# Patient Record
Sex: Male | Born: 1965 | Race: White | Hispanic: No | Smoking: Former smoker
Health system: Southern US, Community
[De-identification: ages and names within clinical notes are randomized; demographics above are authoritative.]

## PROBLEM LIST (undated history)

## (undated) DIAGNOSIS — J449 Chronic obstructive pulmonary disease, unspecified: Secondary | ICD-10-CM

## (undated) DIAGNOSIS — G20A1 Parkinson's disease without dyskinesia, without mention of fluctuations: Secondary | ICD-10-CM

## (undated) DIAGNOSIS — F419 Anxiety disorder, unspecified: Secondary | ICD-10-CM

## (undated) DIAGNOSIS — E7800 Pure hypercholesterolemia, unspecified: Secondary | ICD-10-CM

## (undated) DIAGNOSIS — K259 Gastric ulcer, unspecified as acute or chronic, without hemorrhage or perforation: Secondary | ICD-10-CM

## (undated) DIAGNOSIS — F32A Depression, unspecified: Secondary | ICD-10-CM

## (undated) DIAGNOSIS — F329 Major depressive disorder, single episode, unspecified: Secondary | ICD-10-CM

## (undated) HISTORY — PX: NO PAST SURGERIES: SHX2092

---
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
## 2005-08-20 ENCOUNTER — Ambulatory Visit: Payer: Self-pay | Admitting: Orthopaedic Surgery

## 2006-03-31 ENCOUNTER — Emergency Department: Payer: Self-pay | Admitting: Internal Medicine

## 2007-09-24 ENCOUNTER — Emergency Department: Payer: Self-pay | Admitting: Emergency Medicine

## 2010-03-28 ENCOUNTER — Ambulatory Visit
Admission: RE | Admit: 2010-03-28 | Discharge: 2010-03-28 | Payer: Self-pay | Source: Home / Self Care | Attending: Internal Medicine | Admitting: Internal Medicine

## 2010-03-29 DIAGNOSIS — J309 Allergic rhinitis, unspecified: Secondary | ICD-10-CM | POA: Insufficient documentation
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
## 2011-11-02 ENCOUNTER — Emergency Department (HOSPITAL_COMMUNITY)
Admission: EM | Admit: 2011-11-02 | Discharge: 2011-11-02 | Disposition: A | Discharge status: Home / Self Care | Payer: BC Managed Care – PPO | Attending: Emergency Medicine | Admitting: Emergency Medicine

## 2011-11-02 ENCOUNTER — Encounter (HOSPITAL_COMMUNITY): Payer: Self-pay | Admitting: Emergency Medicine

## 2011-11-02 DIAGNOSIS — K089 Disorder of teeth and supporting structures, unspecified: Secondary | ICD-10-CM | POA: Insufficient documentation

## 2011-11-02 DIAGNOSIS — K0889 Other specified disorders of teeth and supporting structures: Secondary | ICD-10-CM

## 2011-11-02 DIAGNOSIS — F17200 Nicotine dependence, unspecified, uncomplicated: Secondary | ICD-10-CM | POA: Insufficient documentation

## 2011-11-02 MED ORDER — HYDROCODONE-ACETAMINOPHEN 5-325 MG PO TABS: ORAL_TABLET | ORAL | Status: AC

## 2011-11-02 MED ORDER — AMOXICILLIN 250 MG PO CAPS
500.0000 mg | ORAL_CAPSULE | Freq: Once | ORAL | Status: AC
  Administered 2011-11-02: 500 mg via ORAL
  Filled 2011-11-02: qty 2

## 2011-11-02 MED ORDER — HYDROCODONE-ACETAMINOPHEN 5-325 MG PO TABS
1.0000 | ORAL_TABLET | Freq: Once | ORAL | Status: AC
  Administered 2011-11-02: 1 via ORAL
  Filled 2011-11-02: qty 1

## 2011-11-02 MED ORDER — PENICILLIN V POTASSIUM 500 MG PO TABS: 500.0000 mg | ORAL_TABLET | Freq: Four times a day (QID) | ORAL | Status: AC

## 2011-11-02 NOTE — ED Notes (Signed)
Patient c/o lower right tooth pain since yesterday.

## 2011-11-02 NOTE — ED Provider Notes (Signed)
History     CSN: [id number]  Arrival date & time 11/02/11  0018   First MD Initiated Contact with Patient 11/02/11 [phone]      Chief Complaint  Patient presents with  . Dental Pain    (Consider location/radiation/quality/duration/timing/severity/associated sxs/prior treatment) Patient is a 46 y.o. male presenting with tooth pain. The history is provided by the patient.  Dental PainThe primary symptoms include mouth pain. Primary symptoms do not include oral bleeding, oral lesions, headaches, fever, shortness of breath, sore throat, angioedema or cough. The symptoms began yesterday. The symptoms are worsening. The symptoms are new. The symptoms occur constantly.  Mouth pain occurs constantly. Affected locations include: teeth and gum(s). The mouth pain is currently at 10/10.  Additional symptoms include: dental sensitivity to temperature, gum swelling and gum tenderness. Additional symptoms do not include: purulent gums, trismus, jaw pain, facial swelling, trouble swallowing, drooling, ear pain, nosebleeds and swollen glands. Medical issues include: smoking and periodontal disease.    History reviewed. No pertinent past medical history.  History reviewed. No pertinent past surgical history.  No family history on file.  History  Substance Use Topics  . Smoking status: Current Everyday Smoker  . Smokeless tobacco: Not on file  . Alcohol Use: No      Review of Systems  Constitutional: Negative for fever and appetite change.  HENT: Positive for dental problem. Negative for ear pain, nosebleeds, congestion, sore throat, facial swelling, drooling, trouble swallowing, neck pain and neck stiffness.   Eyes: Negative for pain and visual disturbance.  Respiratory: Negative for cough and shortness of breath.   Neurological: Negative for dizziness, facial asymmetry and headaches.  Hematological: Negative for adenopathy.  All other systems reviewed and are negative.    Allergies  Review  of patient's allergies indicates no known allergies.  Home Medications   Current Outpatient Rx  Name Route Sig Dispense Refill  . HYDROCODONE-ACETAMINOPHEN 5-325 MG PO TABS  Take one-two tabs po q 4-6 hrs prn pain 24 tablet 0  . PENICILLIN V POTASSIUM 500 MG PO TABS Oral Take 1 tablet (500 mg total) by mouth 4 (four) times daily. 40 tablet 0    BP 184/99  Pulse 57  Temp 97.6 F (36.4 C) (Oral)  Resp 20  SpO2 97%  Physical Exam  Nursing note and vitals reviewed. Constitutional: He is oriented to person, place, and time. He appears well-developed and well-nourished. No distress.  HENT:  Head: Normocephalic and atraumatic. No trismus in the jaw.  Right Ear: Tympanic membrane and ear canal normal.  Left Ear: Tympanic membrane and ear canal normal.  Mouth/Throat: Uvula is midline, oropharynx is clear and moist and mucous membranes are normal. Dental caries present. No dental abscesses or uvula swelling.    Neck: Normal range of motion. Neck supple.  Cardiovascular: Normal rate, regular rhythm and normal heart sounds.   No murmur heard. Pulmonary/Chest: Effort normal and breath sounds normal.  Musculoskeletal: Normal range of motion.  Lymphadenopathy:    He has no cervical adenopathy.  Neurological: He is alert and oriented to person, place, and time. He exhibits normal muscle tone. Coordination normal.  Skin: Skin is warm and dry.    ED Course  Procedures (including critical care time)  Labs Reviewed - No data to display   1. Pain, dental       MDM    Multiple dental caries.  ttp of the right lower premolars and surrounding gums.  No facial edema, trismus or obvious abscess.  The patient appears reasonably screened and/or stabilized for discharge and I doubt any other medical condition or other [facility] requiring further screening, evaluation, or treatment in the ED at this time prior to discharge.   Prescribed: Pen VK Norco #24  Tayron Hunnell L. Kina Shiffman, [place] 11/02/11  1914

## 2011-11-03 NOTE — ED Provider Notes (Signed)
Medical screening examination/treatment/procedure(s) were performed by non-physician practitioner and as supervising physician I was immediately available for consultation/collaboration.  Shelda Jakes, MD 11/03/11 1051

## 2016-03-09 ENCOUNTER — Emergency Department (HOSPITAL_COMMUNITY): Payer: BLUE CROSS/BLUE SHIELD

## 2016-03-09 ENCOUNTER — Encounter (HOSPITAL_COMMUNITY): Payer: Self-pay | Admitting: Emergency Medicine

## 2016-03-09 ENCOUNTER — Emergency Department (HOSPITAL_COMMUNITY)
Admission: EM | Admit: 2016-03-09 | Discharge: 2016-03-09 | Disposition: A | Discharge status: Home / Self Care | Payer: BLUE CROSS/BLUE SHIELD | Attending: Emergency Medicine | Admitting: Emergency Medicine

## 2016-03-09 DIAGNOSIS — R079 Chest pain, unspecified: Secondary | ICD-10-CM | POA: Diagnosis not present

## 2016-03-09 DIAGNOSIS — F17200 Nicotine dependence, unspecified, uncomplicated: Secondary | ICD-10-CM | POA: Insufficient documentation

## 2016-03-09 DIAGNOSIS — R0789 Other chest pain: Secondary | ICD-10-CM | POA: Insufficient documentation

## 2016-03-09 DIAGNOSIS — J449 Chronic obstructive pulmonary disease, unspecified: Secondary | ICD-10-CM | POA: Diagnosis not present

## 2016-03-09 DIAGNOSIS — I10 Essential (primary) hypertension: Secondary | ICD-10-CM | POA: Diagnosis not present

## 2016-03-09 DIAGNOSIS — R55 Syncope and collapse: Secondary | ICD-10-CM | POA: Diagnosis not present

## 2016-03-09 DIAGNOSIS — F419 Anxiety disorder, unspecified: Secondary | ICD-10-CM | POA: Diagnosis not present

## 2016-03-09 DIAGNOSIS — R061 Stridor: Secondary | ICD-10-CM | POA: Diagnosis not present

## 2016-03-09 LAB — CBC
HCT: 43.6 % (ref 39.0–52.0)
Hemoglobin: 15.1 g/dL (ref 13.0–17.0)
MCH: 31.1 pg (ref 26.0–34.0)
MCHC: 34.6 g/dL (ref 30.0–36.0)
MCV: 89.9 fL (ref 78.0–100.0)
Platelets: 233 10*3/uL (ref 150–400)
RBC: 4.85 MIL/uL (ref 4.22–5.81)
RDW: 13.2 % (ref 11.5–15.5)
WBC: 10.9 10*3/uL — ABNORMAL HIGH (ref 4.0–10.5)

## 2016-03-09 LAB — BASIC METABOLIC PANEL
Anion gap: 9 (ref 5–15)
BUN: 16 mg/dL (ref 6–20)
CALCIUM: 9.3 mg/dL (ref 8.9–10.3)
CO2: 27 mmol/L (ref 22–32)
Chloride: 103 mmol/L (ref 101–111)
Creatinine, Ser: 0.94 mg/dL (ref 0.61–1.24)
GFR calc Af Amer: >60 mL/min (ref >60)
GLUCOSE: 114 mg/dL — AB (ref 65–99)
Potassium: 4.6 mmol/L (ref 3.5–5.1)
Sodium: 139 mmol/L (ref 135–145)

## 2016-03-09 LAB — I-STAT TROPONIN, ED
TROPONIN I, POC: 0 ng/mL (ref 0.00–0.08)
Troponin i, poc: 0.01 ng/mL (ref 0.00–0.08)

## 2016-03-09 NOTE — ED Provider Notes (Signed)
[facility]-EMERGENCY DEPT Provider Note   CSN: [id number] Arrival date & time: 03/09/16  [id number]     History   Chief Complaint Chief Complaint  Patient presents with  . Chest Pain   HPI Hector Davies is a 51 y.o. male who presents with chest pain. PMH significant for COPD, heavy tobacco use, depression/anxiety. He states that he has been very stressed recently due to having to go to court this morning. Last night he ate a meatball sub and went to sleep. He woke up this morning and drank some coffee and smoked 10 cigarettes in a row. He felt very stressed and then nauseous. He had several episodes of vomiting and then had an episode of chest pain. It was on the left side of his chest, non-radiating, sharp, lasted for a couple seconds. He endorses associated left arm numbness/tingling. He states that his son in law had to pick him up and put him in a chair because he was near-syncopal. EMS was called and transported him here. He currently denies pain. Denies fever, chills, SOB, cough, wheezing, palpations, leg swelling, abdominal pain. He reports a lot of personal stress recently and states he has a lot of difficulty managed the stress and trying to quit smoking. +Family hx of heart disease - he states his brother had a heart attack around his age. His father had a stroke.  HPI  History reviewed. No pertinent past medical history.  Patient Active Problem List   Diagnosis Date Noted  . ALLERGIC RHINITIS 03/29/2010    No past surgical history on file.     Home Medications    Prior to Admission medications   Not on File    Family History No family history on file.  Social History Social History  Substance Use Topics  . Smoking status: Current Every Day Smoker  . Smokeless tobacco: Never Used  . Alcohol use Yes     Allergies   Patient has no known allergies.   Review of Systems Review of Systems  Constitutional: Negative for chills, diaphoresis and fever.  Respiratory:  Negative for cough, shortness of breath and wheezing.   Cardiovascular: Positive for chest pain.  Gastrointestinal: Positive for nausea and vomiting. Negative for abdominal pain.  Neurological: Positive for light-headedness. Negative for syncope.  Psychiatric/Behavioral: The patient is nervous/anxious.   All other systems reviewed and are negative.    Physical Exam Updated Vital Signs BP 158/83 (BP Location: Right Arm)   Pulse 66   Temp 98.7 F (37.1 C) (Oral)   Resp 16   SpO2 99%   Physical Exam  Constitutional: He is oriented to person, place, and time. He appears well-developed and well-nourished. No distress.  HENT:  Head: Normocephalic and atraumatic.  Poor dentition  Eyes: Conjunctivae are normal. Pupils are equal, round, and reactive to light. Right eye exhibits no discharge. Left eye exhibits no discharge. No scleral icterus.  Neck: Normal range of motion.  Cardiovascular: Normal rate and regular rhythm.  Exam reveals no gallop and no friction rub.   No murmur heard. Pulmonary/Chest: Effort normal and breath sounds normal. No respiratory distress. He has no wheezes. He has no rales. He exhibits no tenderness.  Abdominal: Soft. Bowel sounds are normal. He exhibits no distension and no mass. There is no tenderness. There is no rebound and no guarding. No hernia.  Neurological: He is alert and oriented to person, place, and time.  Ambulatory  Skin: Skin is warm and dry.  Psychiatric: He has a normal  mood and affect. His behavior is normal.  Nursing note and vitals reviewed.    ED Treatments / Results  Labs (all labs ordered are listed, but only abnormal results are displayed) Labs Reviewed  BASIC METABOLIC PANEL - Abnormal; Notable for the following:       Result Value   Glucose, Bld 114 (*)    All other components within normal limits  CBC - Abnormal; Notable for the following:    WBC 10.9 (*)    All other components within normal limits  I-STAT TROPOININ, ED    I-STAT TROPOININ, ED    EKG  EKG Interpretation  Date/Time:  Friday March 09 2016 09:33:07 EST Ventricular Rate:  65 PR Interval:    QRS Duration: 100 QT Interval:  405 QTC Calculation: 422 R Axis:   -74 Text Interpretation:  Sinus rhythm Left anterior fascicular block ST elev, probable normal early repol pattern No old tracing to compare Confirmed by ISAACS MD, CAMERON [phone]) on 03/09/2016 11:37:59 AM       Radiology Dg Chest 2 View  Result Date: 03/09/2016 CLINICAL DATA:  Left chest and arm pain beginning this morning. EXAM: CHEST  2 VIEW COMPARISON:  None. FINDINGS: The chest is hyperexpanded with attenuation of the pulmonary vasculature. Lungs are clear. Heart size is normal. No pneumothorax or pleural effusion. Aortic atherosclerosis is seen. IMPRESSION: No acute disease. COPD. Atherosclerosis. Electronically Signed   By: Drusilla Kanner M.D.   On: 03/09/2016 10:16    Procedures Procedures (including critical care time)  Medications Ordered in ED Medications - No data to display   Initial Impression / Assessment and Plan / ED Course  I have reviewed the triage vital signs and the nursing notes.  Pertinent labs & imaging results that were available during my care of the patient were reviewed by me and considered in my medical decision making (see chart for details).  Clinical Course    51 year old male presents for evaluation of chest pain. He has some typical and atypical features of his pain.  Currently pain free. Chest pain work up is reassuring. EKG is SR with left anterior fascicular block. CXR remarkable for changes consistent with COPD. Initial and second troponin is 0. Labs are overall unremarkable. Patient is non-smoker. HEART score is 3. His pain may be anxiety related however due to age and risk factors, advised cardiology follow up for possible stress testing. Patient is NAD, non-toxic, with stable VS. Patient is informed of clinical course, understands  medical decision making process, and agrees with plan. Opportunity for questions provided and all questions answered. Return precautions given.   Final Clinical Impressions(s) / ED Diagnoses   Final diagnoses:  Atypical chest pain  Anxiety    New Prescriptions New Prescriptions   No medications on file     Bethel Born, PA-C 03/10/16 4098    Shaune Pollack, MD 03/10/16 1241

## 2016-03-09 NOTE — ED Notes (Signed)
Pt A&Ox4, ambulatory at d/c with steady gait,nAd

## 2016-03-09 NOTE — ED Notes (Signed)
EKG given to Dr. Isaacs 

## 2016-03-09 NOTE — ED Notes (Signed)
Pt states he has all of his belongings with him at discharge

## 2016-03-09 NOTE — ED Triage Notes (Signed)
Pt states he has been under some stress and today he was supposed to go to court for child support . Pt states he woke up drank some coffee and vomited x 4 and then had cp that radiated to left varm

## 2016-03-09 NOTE — ED Triage Notes (Signed)
May have had painic attack and may have become syncopal

## 2016-03-09 NOTE — Discharge Instructions (Signed)
Please follow up with Cardiology for stress testing Follow up with your family doctor as well to help manage quitting smoking and anxiety

## 2016-03-09 NOTE — ED Notes (Signed)
Pts daughters at bedside.

## 2016-03-23 DIAGNOSIS — Z23 Encounter for immunization: Secondary | ICD-10-CM | POA: Diagnosis not present

## 2016-11-14 ENCOUNTER — Encounter (HOSPITAL_COMMUNITY): Payer: Self-pay | Admitting: *Deleted

## 2016-11-14 ENCOUNTER — Emergency Department (HOSPITAL_COMMUNITY)
Admission: EM | Admit: 2016-11-14 | Discharge: 2016-11-14 | Disposition: A | Discharge status: Home / Self Care | Payer: BLUE CROSS/BLUE SHIELD | Attending: Emergency Medicine | Admitting: Emergency Medicine

## 2016-11-14 DIAGNOSIS — K0889 Other specified disorders of teeth and supporting structures: Secondary | ICD-10-CM | POA: Diagnosis not present

## 2016-11-14 DIAGNOSIS — F17200 Nicotine dependence, unspecified, uncomplicated: Secondary | ICD-10-CM | POA: Insufficient documentation

## 2016-11-14 DIAGNOSIS — K029 Dental caries, unspecified: Secondary | ICD-10-CM | POA: Diagnosis not present

## 2016-11-14 MED ORDER — PENICILLIN V POTASSIUM 250 MG PO TABS: 250.0000 mg | ORAL_TABLET | Freq: Four times a day (QID) | ORAL | 0 refills | Status: DC

## 2016-11-14 MED ORDER — NAPROXEN 250 MG PO TABS: 250.0000 mg | ORAL_TABLET | Freq: Two times a day (BID) | ORAL | 0 refills | Status: DC | PRN

## 2016-11-14 NOTE — Discharge Instructions (Signed)
Take the prescriptions as directed.  Call the dentist tomorrow to schedule a follow up appointment within the next week.  Return to the Emergency Department immediately sooner if worsening.

## 2016-11-14 NOTE — ED Provider Notes (Signed)
[facility]-EMERGENCY DEPT Provider Note   CSN: [id number] Arrival date & time: 11/14/16  1918     History   Chief Complaint Chief Complaint  Patient presents with  . Dental Pain    HPI Hector Davies is a 51 y.o. male.  HPI  Pt was seen at 1950. Per pt, c/o gradual onset and persistence of constant left lower tooth "pain" since last night.  Denies fevers, no intra-oral edema, no rash, no facial swelling, no dysphagia, no neck pain.   The condition is aggravated by nothing. The condition is relieved by nothing. The symptoms have been associated with no other complaints. The patient has no significant history of serious medical conditions.    History reviewed. No pertinent past medical history.  Patient Active Problem List   Diagnosis Date Noted  . ALLERGIC RHINITIS 03/29/2010    History reviewed. No pertinent surgical history.     Home Medications    Prior to Admission medications   Not on File    Family History No family history on file.  Social History Social History  Substance Use Topics  . Smoking status: Current Every Day Smoker  . Smokeless tobacco: Never Used  . Alcohol use Yes     Allergies   Patient has no known allergies.   Review of Systems Review of Systems ROS: Statement: All systems negative except as marked or noted in the HPI; Constitutional: Negative for fever and chills. ; ; Eyes: Negative for eye pain and discharge. ; ; ENMT: Positive for dental caries, dental hygiene poor and toothache. Negative for ear pain, bleeding gums, dental injury, facial deformity, facial swelling, hoarseness, nasal congestion, sinus pressure, sore throat, throat swelling and tongue swollen. ; ; Cardiovascular: Negative for chest pain, palpitations, diaphoresis, dyspnea and peripheral edema. ; ; Respiratory: Negative for cough, wheezing and stridor. ; ; Gastrointestinal: Negative for nausea, vomiting, diarrhea and abdominal pain. ; ; Genitourinary: Negative for  dysuria, flank pain and hematuria. ; ; Musculoskeletal: Negative for back pain and neck pain. ; ; Skin: Negative for rash and skin lesion. ; ; Neuro: Negative for headache, lightheadedness and neck stiffness. ;    Physical Exam Updated Vital Signs BP (!) 161/77   Pulse 63   Temp 98.7 F (37.1 C) (Oral)   Resp 20   Ht  (1.753 m)   Wt 70.3 kg (155 lb)   SpO2 96%   BMI 22.89 kg/m   Physical Exam 1955: Physical examination: Vital signs and O2 SAT: Reviewed; Constitutional: Well developed, Well nourished, Well hydrated, In no acute distress; Head and Face: Normocephalic, Atraumatic; Eyes: EOMI, PERRL, No scleral icterus; ENMT: Mouth and pharynx normal, Poor dentition, Widespread dental decay, multiple missing teeth. Left TM normal, Right TM normal, Mucous membranes moist, +lower left 1st and 2nd molars with extensive dental decay.  No gingival erythema, edema, fluctuance, or drainage.  No intra-oral edema. No submandibular or sublingual edema. No hoarse voice, no drooling, no stridor. No trismus. ; Neck: Supple, Full range of motion, No lymphadenopathy; Cardiovascular: Regular rate and rhythm, No murmur, rub, or gallop; Respiratory: Breath sounds clear & equal bilaterally, No rales, rhonchi, wheezes, Normal respiratory effort/excursion; Chest: Nontender, Movement normal; Extremities: Pulses normal, No tenderness, No edema; Neuro: AA&Ox3, Major CN grossly intact.  No gross focal motor or sensory deficits in extremities. Climbs on and off stretcher easily by himself. Gait steady.; Skin: Color normal, No rash, No petechiae, Warm, Dry   ED Treatments / Results  Labs (all labs ordered  are listed, but only abnormal results are displayed)   EKG  EKG Interpretation None       Radiology   Procedures Procedures (including critical care time)  Medications Ordered in ED Medications - No data to display   Initial Impression / Assessment and Plan / ED Course  I have reviewed the triage  vital signs and the nursing notes.  Pertinent labs & imaging results that were available during my care of the patient were reviewed by me and considered in my medical decision making (see chart for details).  MDM Reviewed: previous chart, nursing note and vitals    1955: Pt encouraged to f/u with dentist or oral surgeon for his dental needs for good continuity of care and definitive treatment.  Pt verb understanding.    Final Clinical Impressions(s) / ED Diagnoses   Final diagnoses:  None    New Prescriptions New Prescriptions   No medications on file     Samuel Jester, DO 11/19/16 1209

## 2016-11-14 NOTE — ED Notes (Signed)
Dr Clarene Duke seeing pt in triage

## 2016-11-14 NOTE — ED Triage Notes (Signed)
Pt c/o left lower dental pain that started last night after eating pizza,

## 2017-11-20 DIAGNOSIS — L989 Disorder of the skin and subcutaneous tissue, unspecified: Secondary | ICD-10-CM | POA: Diagnosis not present

## 2017-11-20 DIAGNOSIS — Z1389 Encounter for screening for other disorder: Secondary | ICD-10-CM | POA: Diagnosis not present

## 2017-11-20 DIAGNOSIS — Z23 Encounter for immunization: Secondary | ICD-10-CM | POA: Diagnosis not present

## 2017-11-20 DIAGNOSIS — J449 Chronic obstructive pulmonary disease, unspecified: Secondary | ICD-10-CM | POA: Diagnosis not present

## 2017-11-20 DIAGNOSIS — Z6826 Body mass index (BMI) 26.0-26.9, adult: Secondary | ICD-10-CM | POA: Diagnosis not present

## 2017-11-20 DIAGNOSIS — Z0000 Encounter for general adult medical examination without abnormal findings: Secondary | ICD-10-CM | POA: Diagnosis not present

## 2017-12-03 DIAGNOSIS — Z23 Encounter for immunization: Secondary | ICD-10-CM | POA: Diagnosis not present

## 2017-12-03 DIAGNOSIS — Z6826 Body mass index (BMI) 26.0-26.9, adult: Secondary | ICD-10-CM | POA: Diagnosis not present

## 2017-12-03 DIAGNOSIS — Z1389 Encounter for screening for other disorder: Secondary | ICD-10-CM | POA: Diagnosis not present

## 2017-12-03 DIAGNOSIS — Z0000 Encounter for general adult medical examination without abnormal findings: Secondary | ICD-10-CM | POA: Diagnosis not present

## 2017-12-30 ENCOUNTER — Ambulatory Visit (INDEPENDENT_AMBULATORY_CARE_PROVIDER_SITE_OTHER): Payer: Self-pay

## 2017-12-30 DIAGNOSIS — Z1211 Encounter for screening for malignant neoplasm of colon: Secondary | ICD-10-CM

## 2017-12-30 MED ORDER — NA SULFATE-K SULFATE-MG SULF 17.5-3.13-1.6 GM/177ML PO SOLN: 1.0000 | ORAL | 0 refills | Status: DC

## 2017-12-30 NOTE — Patient Instructions (Addendum)
Hector Davies   January 09, 1966 MRN: [id number]    Procedure Date: 08/18/2018 Time to register: 12:00pm Place to register: [facility] Procedure Time: 1:00pm Scheduled provider: Barney Drain, MD  PREPARATION FOR COLONOSCOPY WITH TRI-LYTE SPLIT PREP  Please notify us immediately if you are diabetic, take iron supplements, or if you are on Coumadin or any other blood thinners.   You will need to purchase 1 fleet enema and 1 box of Bisacodyl '5mg'$  tablets. You can purchase these over the counter at your pharmacy.     1 DAY BEFORE PROCEDURE:  DATE: 08/17/2018  DAY: Sunday  clear liquids the entire day - NO SOLID FOOD.   At 2:00 pm:  Take 2 Bisacodyl tablets.   At 4:00pm:  Start drinking your solution. Make sure you mix well per instructions on the bottle. Try to drink 1 (one) 8 ounce glass every 10-15 minutes until you have consumed HALF the jug. You should complete by 6:00pm.You must keep the left over solution refrigerated until completed next day.  Continue clear liquids. You must drink plenty of clear liquids to prevent dehyration and kidney failure.     DAY OF PROCEDURE:   DATE: 08/18/2018  DAY: Monday If you take medications for your heart, blood pressure or breathing, you may take these medications.   Five hours before your procedure time @ 8:00am:  Finish remaining amout of bowel prep, drinking 1 (one) 8 ounce glass every 10-15 minutes until complete. You have two hours to consume remaining prep.   Three hours before your procedure time @:10:00am:  Nothing by mouth.   At least one hour before going to the hospital:  Give yourself one Fleet enema.  You may take your morning medications with sip of water unless we have instructed otherwise.      Please see below for Dietary Information.  CLEAR LIQUIDS INCLUDE:  Water Jello (NOT red in color)   Ice Popsicles (NOT red in color)   Tea (sugar ok, no milk/cream) Powdered fruit flavored drinks  Coffee (sugar ok, no  milk/cream) Gatorade/ Lemonade/ Kool-Aid  (NOT red in color)   Juice: apple, white grape, white cranberry Soft drinks  Clear bullion, consomme, broth (fat free beef/chicken/vegetable)  Carbonated beverages (any kind)  Strained chicken noodle soup Hard Candy   Remember: Clear liquids are liquids that will allow you to see your fingers on the other side of a clear glass. Be sure liquids are NOT red in color, and not cloudy, but CLEAR.  DO NOT EAT OR DRINK ANY OF THE FOLLOWING:  Dairy products of any kind   Cranberry juice Tomato juice / V8 juice   Grapefruit juice Orange juice     Red grape juice  Do not eat any solid foods, including such foods as: cereal, oatmeal, yogurt, fruits, vegetables, creamed soups, eggs, bread, crackers, pureed foods in a blender, etc.   HELPFUL HINTS FOR DRINKING PREP SOLUTION:   Make sure prep is extremely cold. Mix and refrigerate the the morning of the prep. You may also put in the freezer.   You may try mixing some Crystal Light or Country Time Lemonade if you prefer. Mix in small amounts; add more if necessary.  Try drinking through a straw  Rinse mouth with water or a mouthwash between glasses, to remove after-taste.  Try sipping on a cold beverage /ice/ popsicles between glasses of prep.  Place a piece of sugar-free hard candy in mouth between glasses.  If you become nauseated, try  consuming smaller amounts, or stretch out the time between glasses. Stop for 30-60 minutes, then slowly start back drinking.        OTHER INSTRUCTIONS  You will need a responsible adult at least 54 years of age to accompany you and drive you home. This person must remain in the waiting room during your procedure. The hospital will cancel your procedure if you do not have a responsible adult with you.   1. Wear loose fitting clothing that is easily removed. 2. Leave jewelry and other valuables at home.  3. Remove all body piercing jewelry and leave at  home. 4. Total time from sign-in until discharge is approximately 2-3 hours. 5. You should go home directly after your procedure and rest. You can resume normal activities the day after your procedure. 6. The day of your procedure you should not:  Drive  Make legal decisions  Operate machinery  Drink alcohol  Return to work   You may call the office (Dept: [phone]) before 5:00pm, or page the doctor on call [phone]) after 5:00pm, for further instructions, if necessary.   Insurance Information YOU WILL NEED TO CHECK WITH YOUR INSURANCE COMPANY FOR THE BENEFITS OF COVERAGE YOU HAVE FOR THIS PROCEDURE.  UNFORTUNATELY, NOT ALL INSURANCE COMPANIES HAVE BENEFITS TO COVER ALL OR PART OF THESE TYPES OF PROCEDURES.  IT IS YOUR RESPONSIBILITY TO CHECK YOUR BENEFITS, HOWEVER, WE WILL BE GLAD TO ASSIST YOU WITH ANY CODES YOUR INSURANCE COMPANY MAY NEED.    PLEASE NOTE THAT MOST INSURANCE COMPANIES WILL NOT COVER A SCREENING COLONOSCOPY FOR PEOPLE UNDER THE AGE OF 50  IF YOU HAVE BCBS INSURANCE, YOU MAY HAVE BENEFITS FOR A SCREENING COLONOSCOPY BUT IF POLYPS ARE FOUND THE DIAGNOSIS WILL CHANGE AND THEN YOU MAY HAVE A DEDUCTIBLE THAT WILL NEED TO BE MET. SO PLEASE MAKE SURE YOU CHECK YOUR BENEFITS FOR A SCREENING COLONOSCOPY AS WELL AS A DIAGNOSTIC COLONOSCOPY.

## 2017-12-30 NOTE — Progress Notes (Addendum)
Gastroenterology Pre-Procedure Review  Request Date:12/30/17 Requesting Physician: Linford Arnold PA- Robbie Lis no previous tcs  PATIENT REVIEW QUESTIONS: The patient responded to the following health history questions as indicated:    1. Diabetes Melitis: no 2. Joint replacements in the past 12 months: no 3. Major health problems in the past 3 months: no 4. Has an artificial valve or MVP: no 5. Has a defibrillator: no 6. Has been advised in past to take antibiotics in advance of a procedure like teeth cleaning: no 7. Family history of colon cancer: no  8. Alcohol Use: yes (occasionally) 9. History of sleep apnea: no  10. History of coronary artery or other vascular stents placed within the last 12 months: no 11. History of any prior anesthesia complications: no    MEDICATIONS & ALLERGIES:    Patient reports the following regarding taking any blood thinners:   Plavix? no Aspirin? no Coumadin? no Brilinta? no Xarelto? no Eliquis? no Pradaxa? no Savaysa? no Effient? no  Patient confirms/reports the following medications:  No current outpatient medications on file.   No current facility-administered medications for this visit.     Patient confirms/reports the following allergies:  No Known Allergies  No orders of the defined types were placed in this encounter.   AUTHORIZATION INFORMATION Primary Insurance: [employer]  ID #: [id number] Pre-Cert / Berkley Harvey required: no Pre-Cert / Auth #: file with local BCBS  SCHEDULE INFORMATION: Procedure has been scheduled as follows:  Date:03/07/18 , Time: 9:30 Location: [facility]  This Gastroenterology Pre-Precedure Review Form is being routed to the following provider(s): Tana Coast, PA

## 2017-12-31 DIAGNOSIS — F419 Anxiety disorder, unspecified: Secondary | ICD-10-CM | POA: Diagnosis not present

## 2017-12-31 DIAGNOSIS — Z6826 Body mass index (BMI) 26.0-26.9, adult: Secondary | ICD-10-CM | POA: Diagnosis not present

## 2017-12-31 DIAGNOSIS — E663 Overweight: Secondary | ICD-10-CM | POA: Diagnosis not present

## 2018-01-01 NOTE — Progress Notes (Signed)
OK TO SCHEDULE

## 2018-01-30 NOTE — Progress Notes (Signed)
Due to a change in [facility] schedule for 03/07/18, we have moved pt to [facility] schedule. Tried to call the pt x2 and the call went straight to voicemail and the mailbox was full. I have mailed him a letter with this information.

## 2018-03-06 MED ORDER — PEG 3350-KCL-NA BICARB-NACL 420 G PO SOLR: 4000.0000 mL | ORAL | 0 refills | Status: DC

## 2018-03-06 NOTE — Progress Notes (Signed)
Pt called- tcs is tomorrow.  he went to the pharmacy today to get his prep and it was $50 and he cannot afford it. I do not have any copay cards to give him. Pt has also lost his instructions. Pt was originally scheduled with [facility], but due to a change in the schedule, [facility] was going to do his procedure. Pt would like to reschedule his procedure with [facility]. I have moved him to 05/16/18 and mailed him new instructions. New rx for trilyte sent in as well. Pt is aware.

## 2018-03-06 NOTE — Addendum Note (Signed)
Addended by: Myra Rude on: 03/06/2018 01:00 PM   Modules accepted: Orders

## 2018-04-26 ENCOUNTER — Other Ambulatory Visit: Payer: Self-pay

## 2018-04-26 ENCOUNTER — Emergency Department (HOSPITAL_COMMUNITY)
Admission: EM | Admit: 2018-04-26 | Discharge: 2018-04-26 | Disposition: A | Discharge status: Home / Self Care | Payer: Worker's Compensation | Attending: Emergency Medicine | Admitting: Emergency Medicine

## 2018-04-26 ENCOUNTER — Emergency Department (HOSPITAL_COMMUNITY): Payer: Worker's Compensation

## 2018-04-26 ENCOUNTER — Encounter (HOSPITAL_COMMUNITY): Payer: Self-pay | Admitting: Emergency Medicine

## 2018-04-26 DIAGNOSIS — W208XXA Other cause of strike by thrown, projected or falling object, initial encounter: Secondary | ICD-10-CM | POA: Insufficient documentation

## 2018-04-26 DIAGNOSIS — Y990 Civilian activity done for income or pay: Secondary | ICD-10-CM | POA: Diagnosis not present

## 2018-04-26 DIAGNOSIS — Y929 Unspecified place or not applicable: Secondary | ICD-10-CM | POA: Diagnosis not present

## 2018-04-26 DIAGNOSIS — J449 Chronic obstructive pulmonary disease, unspecified: Secondary | ICD-10-CM | POA: Insufficient documentation

## 2018-04-26 DIAGNOSIS — S90511A Abrasion, right ankle, initial encounter: Secondary | ICD-10-CM | POA: Diagnosis not present

## 2018-04-26 DIAGNOSIS — Y939 Activity, unspecified: Secondary | ICD-10-CM | POA: Insufficient documentation

## 2018-04-26 DIAGNOSIS — S93401A Sprain of unspecified ligament of right ankle, initial encounter: Secondary | ICD-10-CM | POA: Insufficient documentation

## 2018-04-26 DIAGNOSIS — F17200 Nicotine dependence, unspecified, uncomplicated: Secondary | ICD-10-CM | POA: Diagnosis not present

## 2018-04-26 DIAGNOSIS — S99911A Unspecified injury of right ankle, initial encounter: Secondary | ICD-10-CM | POA: Diagnosis present

## 2018-04-26 HISTORY — DX: Chronic obstructive pulmonary disease, unspecified: J44.9

## 2018-04-26 MED ORDER — BACITRACIN-NEOMYCIN-POLYMYXIN 400-5-5000 EX OINT
TOPICAL_OINTMENT | Freq: Once | CUTANEOUS | Status: AC
  Administered 2018-04-26: 22:00:00 via TOPICAL
  Filled 2018-04-26: qty 1

## 2018-04-26 MED ORDER — IBUPROFEN 600 MG PO TABS: 600.0000 mg | ORAL_TABLET | Freq: Four times a day (QID) | ORAL | 0 refills | Status: DC | PRN

## 2018-04-26 NOTE — ED Triage Notes (Addendum)
Pt reports injury happened at work and he will need a drug screen, states his employer provided him with paperwork

## 2018-04-26 NOTE — ED Notes (Signed)
Patient reports he works at [employer] in [place] and needs a drug screen done for his job.

## 2018-04-26 NOTE — ED Notes (Signed)
Patient transported to X-ray 

## 2018-04-26 NOTE — ED Triage Notes (Signed)
Pt c/o right ankle pain after a piece of metal fell on his ankle x 1830 tonight, pt reports bruising and a small laceration

## 2018-04-26 NOTE — ED Notes (Signed)
Notified lab of drug screen and asked security to walk patient over to lab.

## 2018-04-26 NOTE — ED Notes (Signed)
Ace wrap applied to right ankle, pt tolerated well, cms intact distal,

## 2018-04-26 NOTE — ED Provider Notes (Signed)
[facility] EMERGENCY DEPARTMENT Provider Note   CSN: [id number] Arrival date & time: 04/26/18  2056    History   Chief Complaint Chief Complaint  Patient presents with  . Ankle Pain    HPI Hector Davies is a 53 y.o. male.     HPI Patient states that while at work he dropped a piece of equipment that weighed roughly 8 pounds on his right ankle.  This occurred roughly 3 hours ago.  Sustained mild abrasion to the ankle.  Has been ambulatory.  Has had some swelling over the lateral malleolus.  Past Medical History:  Diagnosis Date  . COPD (chronic obstructive pulmonary disease) [facility])     Patient Active Problem List   Diagnosis Date Noted  . ALLERGIC RHINITIS 03/29/2010    History reviewed. No pertinent surgical history.      Home Medications    Prior to Admission medications   Medication Sig Start Date End Date Taking? Authorizing Provider  Calcium Citrate-Vitamin D (CALCIUM + D PO) Take 2 tablets by mouth daily.   Yes [provider]  Multiple Vitamin (MULTIVITAMIN WITH MINERALS) TABS tablet Take 1 tablet by mouth daily.   Yes [provider]  polyethylene glycol-electrolytes (TRILYTE) 420 g solution Take 4,000 mLs by mouth as directed. 03/06/18  Yes Tiffany Kocher, PA-C  ibuprofen (ADVIL,MOTRIN) 600 MG tablet Take 1 tablet (600 mg total) by mouth every 6 (six) hours as needed. 04/26/18   Loren Racer, MD    Family History History reviewed. No pertinent family history.  Social History Social History   Tobacco Use  . Smoking status: Current Every Day Smoker  . Smokeless tobacco: Never Used  Substance Use Topics  . Alcohol use: Yes    Comment: every couple weeks  . Drug use: No     Allergies   Patient has no known allergies.   Review of Systems Review of Systems  Constitutional: Negative for chills and fever.  Musculoskeletal: Positive for arthralgias. Negative for myalgias.  Skin: Positive for wound.  Neurological: Negative  for weakness and numbness.  All other systems reviewed and are negative.    Physical Exam Updated Vital Signs BP (!) 152/93 (BP Location: Right Arm)   Pulse 63   Temp 97.9 F (36.6 C) (Oral)   Resp 19   SpO2 97%   Physical Exam Vitals signs and nursing note reviewed.  Constitutional:      Appearance: Normal appearance. He is well-developed.  HENT:     Head: Normocephalic and atraumatic.  Eyes:     Pupils: Pupils are equal, round, and reactive to light.  Neck:     Musculoskeletal: Normal range of motion and neck supple.  Cardiovascular:     Rate and Rhythm: Normal rate.  Pulmonary:     Effort: Pulmonary effort is normal.  Abdominal:     Palpations: Abdomen is soft.  Musculoskeletal: Normal range of motion.        General: Swelling and tenderness present.     Comments: Patient has mild swelling over the lateral malleolus of the right ankle.  Tenderness to palpation.  No deformity.  Patient has small abrasion just proximal to the lateral malleolus.  No proximal fibular tenderness to palpation.  Distal pulses are 2+.  Skin:    General: Skin is warm and dry.     Capillary Refill: Capillary refill takes less than 2 seconds.     Findings: No erythema or rash.  Neurological:     General: No  focal deficit present.     Mental Status: He is alert and oriented to person, place, and time.     Comments: Moving all extremities without focal deficit.  Sensation fully intact.  Psychiatric:        Mood and Affect: Mood normal.        Behavior: Behavior normal.      ED Treatments / Results  Labs (all labs ordered are listed, but only abnormal results are displayed) Labs Reviewed  RAPID URINE DRUG SCREEN, HOSP PERFORMED    EKG None  Radiology Dg Ankle Complete Right  Result Date: 04/26/2018 CLINICAL DATA:  Right ankle pain after injury tonight. Bruising and laceration. 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 No evidence of acute fracture or dislocation of the right ankle. No focal bone lesion or bone destruction. Small calcaneal spur. Soft tissues are unremarkable. IMPRESSION: No acute bony abnormalities. Electronically Signed   By: Burman Nieves M.D.   On: 04/26/2018 21:40    Procedures Procedures (including critical care time)  Medications Ordered in ED Medications  neomycin-bacitracin-polymyxin (NEOSPORIN) ointment packet ( Topical Given 04/26/18 2227)     Initial Impression / Assessment and Plan / ED Course  I have reviewed the triage vital signs and the nursing notes.  Pertinent labs & imaging results that were available during my care of the patient were reviewed by me and considered in my medical decision making (see chart for details).        X-ray without acute findings.  Question contusion versus sprain.  Will place an Ace wrap.  Final Clinical Impressions(s) / ED Diagnoses   Final diagnoses:  Sprain of right ankle, unspecified ligament, initial encounter  Abrasion of right ankle, initial encounter    ED Discharge Orders         Ordered    ibuprofen (ADVIL,MOTRIN) 600 MG tablet  Every 6 hours PRN     04/26/18 2258           Loren Racer, MD 04/26/18 2259

## 2018-04-30 DIAGNOSIS — E663 Overweight: Secondary | ICD-10-CM | POA: Diagnosis not present

## 2018-04-30 DIAGNOSIS — W57XXXA Bitten or stung by nonvenomous insect and other nonvenomous arthropods, initial encounter: Secondary | ICD-10-CM | POA: Diagnosis not present

## 2018-04-30 DIAGNOSIS — Z1389 Encounter for screening for other disorder: Secondary | ICD-10-CM | POA: Diagnosis not present

## 2018-04-30 DIAGNOSIS — Z6826 Body mass index (BMI) 26.0-26.9, adult: Secondary | ICD-10-CM | POA: Diagnosis not present

## 2018-04-30 DIAGNOSIS — H539 Unspecified visual disturbance: Secondary | ICD-10-CM | POA: Diagnosis not present

## 2018-05-14 NOTE — Progress Notes (Signed)
Pt's colonoscopy re-scheduled to 08/18/2018 due to CDC guidelines pertaining to coronavirus.  Pt aware that we are mailing out new instructions.  Endo notified.

## 2018-05-29 DIAGNOSIS — J449 Chronic obstructive pulmonary disease, unspecified: Secondary | ICD-10-CM | POA: Diagnosis not present

## 2018-06-30 DIAGNOSIS — F4321 Adjustment disorder with depressed mood: Secondary | ICD-10-CM | POA: Diagnosis not present

## 2018-06-30 DIAGNOSIS — J449 Chronic obstructive pulmonary disease, unspecified: Secondary | ICD-10-CM | POA: Diagnosis not present

## 2018-06-30 DIAGNOSIS — S39012A Strain of muscle, fascia and tendon of lower back, initial encounter: Secondary | ICD-10-CM | POA: Diagnosis not present

## 2018-07-03 ENCOUNTER — Emergency Department (HOSPITAL_COMMUNITY): Payer: BLUE CROSS/BLUE SHIELD

## 2018-07-03 ENCOUNTER — Other Ambulatory Visit: Payer: Self-pay

## 2018-07-03 ENCOUNTER — Encounter (HOSPITAL_COMMUNITY): Payer: Self-pay

## 2018-07-03 ENCOUNTER — Emergency Department (HOSPITAL_COMMUNITY)
Admission: EM | Admit: 2018-07-03 | Discharge: 2018-07-03 | Disposition: A | Discharge status: Home / Self Care | Payer: BLUE CROSS/BLUE SHIELD | Attending: Emergency Medicine | Admitting: Emergency Medicine

## 2018-07-03 DIAGNOSIS — Y929 Unspecified place or not applicable: Secondary | ICD-10-CM | POA: Diagnosis not present

## 2018-07-03 DIAGNOSIS — J449 Chronic obstructive pulmonary disease, unspecified: Secondary | ICD-10-CM | POA: Insufficient documentation

## 2018-07-03 DIAGNOSIS — F17210 Nicotine dependence, cigarettes, uncomplicated: Secondary | ICD-10-CM | POA: Diagnosis not present

## 2018-07-03 DIAGNOSIS — Y939 Activity, unspecified: Secondary | ICD-10-CM | POA: Diagnosis not present

## 2018-07-03 DIAGNOSIS — W01190A Fall on same level from slipping, tripping and stumbling with subsequent striking against furniture, initial encounter: Secondary | ICD-10-CM | POA: Diagnosis not present

## 2018-07-03 DIAGNOSIS — Y999 Unspecified external cause status: Secondary | ICD-10-CM | POA: Diagnosis not present

## 2018-07-03 DIAGNOSIS — Z79899 Other long term (current) drug therapy: Secondary | ICD-10-CM | POA: Diagnosis not present

## 2018-07-03 DIAGNOSIS — Y9372 Activity, wrestling: Secondary | ICD-10-CM | POA: Diagnosis not present

## 2018-07-03 DIAGNOSIS — S3992XA Unspecified injury of lower back, initial encounter: Secondary | ICD-10-CM | POA: Diagnosis not present

## 2018-07-03 DIAGNOSIS — S39012A Strain of muscle, fascia and tendon of lower back, initial encounter: Secondary | ICD-10-CM | POA: Insufficient documentation

## 2018-07-03 MED ORDER — KETOROLAC TROMETHAMINE 60 MG/2ML IM SOLN
60.0000 mg | Freq: Once | INTRAMUSCULAR | Status: AC
  Administered 2018-07-03: 10:00:00 60 mg via INTRAMUSCULAR
  Filled 2018-07-03: qty 2

## 2018-07-03 MED ORDER — DIAZEPAM 5 MG PO TABS: 5.0000 mg | ORAL_TABLET | Freq: Four times a day (QID) | ORAL | 0 refills | Status: DC | PRN

## 2018-07-03 MED ORDER — DIAZEPAM 5 MG PO TABS
10.0000 mg | ORAL_TABLET | Freq: Once | ORAL | Status: AC
  Administered 2018-07-03: 10 mg via ORAL
  Filled 2018-07-03: qty 2

## 2018-07-03 NOTE — ED Provider Notes (Signed)
[facility] Provider Note   CSN: [id number] Arrival date & time: 07/03/18  [id number]    History   Chief Complaint Chief Complaint  Patient presents with  . Back Pain    HPI Hector Davies is a 53 y.o. male.     Patient with history of COPD, chronic cigarette smoker presents with lower back pain intermittent radiation into the buttocks.  Patient was wrestling 2 days ago and landed on the edge of the bed.  Patient'Davies had pain and muscle spasm intermittent since.  Patient saw telemetry doc prescribed prednisone and has been doing other supportive care measures without improvement.  Patient denies weakness or numbness in his legs, no changes in bowel or bladder.  No history of back surgery.  No fevers or chills.  Pain movement specific.     Past Medical History:  Diagnosis Date  . COPD (chronic obstructive pulmonary disease) [facility])     Patient Active Problem List   Diagnosis Date Noted  . ALLERGIC RHINITIS 03/29/2010    History reviewed. No pertinent surgical history.      Home Medications    Prior to Admission medications   Medication Sig Start Date End Date Taking? Authorizing Provider  methocarbamol (ROBAXIN) 500 MG tablet Take 500 mg by mouth 3 (three) times daily. 06/30/18  Yes [provider]  predniSONE (DELTASONE) 5 MG tablet Take 6 tablets by mouth daily. 6tab foor 2days then 5tabs daily x2, 4tabs daily x2, 3 tabs daily x2 06/30/18  Yes [provider]  albuterol (VENTOLIN HFA) 108 (90 Base) MCG/ACT inhaler Inhale 2 puffs into the lungs 4 (four) times daily as needed. 05/29/18   [provider]  Calcium Citrate-Vitamin D (CALCIUM + D PO) Take 2 tablets by mouth daily.    [provider]  escitalopram (LEXAPRO) 20 MG tablet Take 20 mg by mouth daily. 06/30/18   [provider]  ibuprofen (ADVIL,MOTRIN) 600 MG tablet Take 1 tablet (600 mg total) by mouth every 6 (six) hours as needed. 04/26/18   Loren Davies, [name],  MD  Multiple Vitamin (MULTIVITAMIN WITH MINERALS) TABS tablet Take 1 tablet by mouth daily.    [provider]  polyethylene glycol-electrolytes (TRILYTE) 420 g solution Take 4,000 mLs by mouth as directed. 03/06/18   Tiffany KocherLewis, Hector S, PA-C  SPIRIVA RESPIMAT 2.5 MCG/ACT AERS Inhale 2 sprays into the lungs daily. 05/29/18   [provider]  zolpidem (AMBIEN) 5 MG tablet Take 5 mg by mouth at bedtime. 06/30/18   [provider]    Family History No family history on file.  Social History Social History   Tobacco Use  . Smoking status: Current Every Day Smoker  . Smokeless tobacco: Never Used  Substance Use Topics  . Alcohol use: Yes    Comment: every couple weeks  . Drug use: No     Allergies   Patient has no known allergies.   Review of Systems Review of Systems  Constitutional: Negative for fever.  Respiratory: Negative for shortness of breath.   Cardiovascular: Negative for chest pain.  Gastrointestinal: Negative for abdominal pain and vomiting.  Genitourinary: Negative for dysuria and flank pain.  Musculoskeletal: Positive for back pain. Negative for neck pain and neck stiffness.  Skin: Negative for rash.  Neurological: Negative for weakness, light-headedness, numbness and headaches.     Physical Exam Updated Vital Signs BP (!) 154/82 (BP Location: Left Arm)   Pulse 75   Temp 98.6 F (37 C) (Oral)  Ht 5\' 8"  (1.727 m)   Wt 80.7 kg   SpO2 95%   BMI 27.06 kg/m   Physical Exam Vitals signs and nursing note reviewed.  Constitutional:      Appearance: He is well-developed.  HENT:     Head: Normocephalic and atraumatic.  Eyes:     General:        Right eye: No discharge.        Left eye: No discharge.     Conjunctiva/sclera: Conjunctivae normal.  Neck:     Musculoskeletal: Normal range of motion.     Trachea: No tracheal deviation.  Cardiovascular:     Rate and Rhythm: Normal rate.  Pulmonary:     Effort: Pulmonary effort is normal.   Abdominal:     General: There is no distension.     Palpations: Abdomen is soft.     Tenderness: There is no abdominal tenderness. There is no guarding.  Musculoskeletal:        General: Tenderness present. No swelling.     Comments: Patient has tenderness and tight musculature midline and paraspinal lower lumbar sacral area.  Skin:    General: Skin is warm.     Findings: No rash.  Neurological:     Mental Status: He is alert and oriented to person, place, and time.     GCS: GCS eye subscore is 4. GCS verbal subscore is 5. GCS motor subscore is 6.     Comments: Patient has 5+ strength with flexion-extension at major joints and lower extremity bilateral.  Sensation intact palpation in major nerves.      ED Treatments / Results  Labs (all labs ordered are listed, but only abnormal results are displayed) Labs Reviewed - No data to display  EKG None  Radiology No results found.  Procedures Procedures (including critical care time)  Medications Ordered in ED Medications  ketorolac (TORADOL) injection 60 mg (60 mg Intramuscular Given 07/03/18 0931)  diazepam (VALIUM) tablet 10 mg (10 mg Oral Given 07/03/18 0931)     Initial Impression / Assessment and Plan / ED Course  I have reviewed the triage vital signs and the nursing notes.  Pertinent labs & imaging results that were available during my care of the patient were reviewed by me and considered in my medical decision making (see chart for details).       Patient presents with clinically musculoskeletal injury and muscle spasm.  Patient has no neuro deficits.  Discussed close outpatient follow-up and will add Valium for muscle spasms.  Patient already on steroids.  Toradol shot given in the ER patient has no contraindications this medication. X-rays lumbar reviewed due to injury, no acute fracture.  No indication for emergent MRI at this time. Results and differential diagnosis were discussed with the  patient/parent/guardian. Xrays were independently reviewed by myself.  Close follow up outpatient was discussed, comfortable with the plan.   Medications  ketorolac (TORADOL) injection 60 mg (60 mg Intramuscular Given 07/03/18 0931)  diazepam (VALIUM) tablet 10 mg (10 mg Oral Given 07/03/18 0931)    Vitals:   07/03/18 0817 07/03/18 0820  BP:  (!) 154/82  Pulse:  75  Temp:  98.6 F (37 C)  TempSrc:  Oral  SpO2:  95%  Weight: 80.7 kg   Height: 5\' 8"  (1.727 m)     Final diagnoses:  Acute myofascial strain of lumbar region, initial encounter    Final Clinical Impressions(Davies) / ED Diagnoses   Final diagnoses:  Acute myofascial  strain of lumbar region, initial encounter    ED Discharge Orders    None       Blane Ohara, MD 07/03/18 [id number]

## 2018-07-03 NOTE — ED Triage Notes (Signed)
Pt was wrestling with his son 2 days ago and now has a pinched nerve in his lower back. Was seen by the teledoc and prescribed prednisone and a muscle relaxer yesterday. Is taking them but states no relief.

## 2018-07-03 NOTE — Discharge Instructions (Addendum)
Return to the emergency room if you develop weakness, persistent numbness, change in bowel or bladder function.  Continue take Tylenol, ibuprofen as needed for pain.  You can continue your prednisone prescription.  For muscle spasm you can try Valium however no driving or operating machinery and remember it has addictive potential.

## 2018-07-04 DIAGNOSIS — J449 Chronic obstructive pulmonary disease, unspecified: Secondary | ICD-10-CM | POA: Diagnosis not present

## 2018-07-04 DIAGNOSIS — M62830 Muscle spasm of back: Secondary | ICD-10-CM | POA: Diagnosis not present

## 2018-07-04 DIAGNOSIS — E663 Overweight: Secondary | ICD-10-CM | POA: Diagnosis not present

## 2018-07-04 DIAGNOSIS — Z6827 Body mass index (BMI) 27.0-27.9, adult: Secondary | ICD-10-CM | POA: Diagnosis not present

## 2018-08-11 ENCOUNTER — Telehealth: Payer: Self-pay | Admitting: *Deleted

## 2018-08-11 NOTE — Telephone Encounter (Signed)
Tried to call pt to schedule his COVID 19 screening but his voice mail has not been set up.  Unable to leave a message.

## 2018-08-12 ENCOUNTER — Telehealth: Payer: Self-pay | Admitting: *Deleted

## 2018-08-12 NOTE — Telephone Encounter (Signed)
Tried to call daughter to leave a message for him since she is on his DPR but her voice mail is not set up.

## 2018-08-12 NOTE — Telephone Encounter (Signed)
Tried to call pt again today but voice mail box is not set up per automated recording.

## 2018-08-13 ENCOUNTER — Telehealth: Payer: Self-pay | Admitting: *Deleted

## 2018-08-13 NOTE — Telephone Encounter (Signed)
Tried to reach pt to schedule COVID 19 screening.  Multiple attempts were made to reach pt on different numbers.  No voice mails set up so unable to leave a message.  Procedure must be re-scheduled at this point (per Hoyle Sauer in Endo) since [place] screening was never scheduled.  Pt unaware of any of this information due to voice mail incapabilities.  Discussed with Hoyle Sauer just in case pt shows up for his procedure on 08/18/2018 that has been cancelled.

## 2018-08-13 NOTE — Telephone Encounter (Signed)
Tried to reach pt again by cell, work, and emergency contact with no success.  Pt was not at work and the other 2 voice mails were not set up to receive messages.

## 2018-08-14 NOTE — Telephone Encounter (Signed)
Tried to reach pt on all numbers listed in Epic again today with no success.  VM is not set up on any of them.  Called all local [employer] in [place] as well but I was told he wasn't an employee at each one that I called.

## 2018-08-15 ENCOUNTER — Telehealth: Payer: Self-pay | Admitting: *Deleted

## 2018-08-15 NOTE — Telephone Encounter (Signed)
Tried to reach pt again today to inform him about his procedure being cancelled.  VM not set up.

## 2018-08-18 ENCOUNTER — Encounter (HOSPITAL_COMMUNITY): Admission: RE | Payer: Self-pay | Source: Home / Self Care

## 2018-08-18 ENCOUNTER — Ambulatory Visit (HOSPITAL_COMMUNITY): Admission: RE | Admit: 2018-08-18 | Payer: Self-pay | Source: Home / Self Care | Admitting: Gastroenterology

## 2018-08-18 SURGERY — COLONOSCOPY
Anesthesia: Moderate Sedation

## 2018-10-29 ENCOUNTER — Encounter: Payer: Self-pay | Admitting: *Deleted

## 2018-11-10 ENCOUNTER — Other Ambulatory Visit: Admission: RE | Admit: 2018-11-10 | Payer: Medicaid Other | Source: Ambulatory Visit

## 2018-11-11 ENCOUNTER — Other Ambulatory Visit: Payer: Self-pay

## 2018-11-11 ENCOUNTER — Other Ambulatory Visit
Admission: RE | Admit: 2018-11-11 | Discharge: 2018-11-11 | Disposition: A | Discharge status: Home / Self Care | Payer: Medicaid Other | Source: Ambulatory Visit | Attending: Ophthalmology | Admitting: Ophthalmology

## 2018-11-11 DIAGNOSIS — Z01812 Encounter for preprocedural laboratory examination: Secondary | ICD-10-CM | POA: Insufficient documentation

## 2018-11-11 DIAGNOSIS — H25041 Posterior subcapsular polar age-related cataract, right eye: Secondary | ICD-10-CM | POA: Diagnosis not present

## 2018-11-11 DIAGNOSIS — Z20828 Contact with and (suspected) exposure to other viral communicable diseases: Secondary | ICD-10-CM | POA: Diagnosis not present

## 2018-11-11 LAB — SARS CORONAVIRUS 2 (TAT 6-24 HRS): SARS Coronavirus 2: NEGATIVE

## 2018-11-13 ENCOUNTER — Ambulatory Visit: Payer: Medicaid Other | Admitting: Anesthesiology

## 2018-11-13 ENCOUNTER — Encounter
Admission: RE | Disposition: A | Discharge status: Home / Self Care | Payer: Self-pay | Source: Home / Self Care | Attending: Ophthalmology

## 2018-11-13 ENCOUNTER — Encounter: Payer: Self-pay | Admitting: *Deleted

## 2018-11-13 ENCOUNTER — Ambulatory Visit
Admission: RE | Admit: 2018-11-13 | Discharge: 2018-11-13 | Disposition: A | Discharge status: Home / Self Care | Payer: Medicaid Other | Attending: Ophthalmology | Admitting: Ophthalmology

## 2018-11-13 DIAGNOSIS — H2511 Age-related nuclear cataract, right eye: Secondary | ICD-10-CM | POA: Diagnosis not present

## 2018-11-13 DIAGNOSIS — E7800 Pure hypercholesterolemia, unspecified: Secondary | ICD-10-CM | POA: Insufficient documentation

## 2018-11-13 DIAGNOSIS — J449 Chronic obstructive pulmonary disease, unspecified: Secondary | ICD-10-CM | POA: Insufficient documentation

## 2018-11-13 DIAGNOSIS — Z79899 Other long term (current) drug therapy: Secondary | ICD-10-CM | POA: Diagnosis not present

## 2018-11-13 DIAGNOSIS — K219 Gastro-esophageal reflux disease without esophagitis: Secondary | ICD-10-CM | POA: Diagnosis not present

## 2018-11-13 DIAGNOSIS — F17200 Nicotine dependence, unspecified, uncomplicated: Secondary | ICD-10-CM | POA: Insufficient documentation

## 2018-11-13 DIAGNOSIS — F329 Major depressive disorder, single episode, unspecified: Secondary | ICD-10-CM | POA: Diagnosis not present

## 2018-11-13 HISTORY — DX: Gastric ulcer, unspecified as acute or chronic, without hemorrhage or perforation: K25.9

## 2018-11-13 HISTORY — DX: Anxiety disorder, unspecified: F41.9

## 2018-11-13 HISTORY — DX: Pure hypercholesterolemia, unspecified: E78.00

## 2018-11-13 HISTORY — DX: Depression, unspecified: F32.A

## 2018-11-13 HISTORY — PX: CATARACT EXTRACTION W/PHACO: SHX586

## 2018-11-13 SURGERY — PHACOEMULSIFICATION, CATARACT, WITH IOL INSERTION
Anesthesia: Monitor Anesthesia Care | Site: Eye | Laterality: Right

## 2018-11-13 MED ORDER — POVIDONE-IODINE 5 % OP SOLN
OPHTHALMIC | Status: AC
  Filled 2018-11-13: qty 30

## 2018-11-13 MED ORDER — EPINEPHRINE PF 1 MG/ML IJ SOLN
INTRAMUSCULAR | Status: AC
  Filled 2018-11-13: qty 1

## 2018-11-13 MED ORDER — MOXIFLOXACIN HCL 0.5 % OP SOLN
OPHTHALMIC | Status: DC | PRN
  Administered 2018-11-13: .2 mL via OPHTHALMIC

## 2018-11-13 MED ORDER — LIDOCAINE HCL (PF) 4 % IJ SOLN
INTRAOCULAR | Status: DC | PRN
  Administered 2018-11-13: 2.25 mL via OPHTHALMIC

## 2018-11-13 MED ORDER — SODIUM CHLORIDE 0.9 % IV SOLN
INTRAVENOUS | Status: DC
  Administered 2018-11-13: 13:00:00 via INTRAVENOUS

## 2018-11-13 MED ORDER — TRYPAN BLUE 0.06 % OP SOLN
OPHTHALMIC | Status: AC
  Filled 2018-11-13: qty 0.5

## 2018-11-13 MED ORDER — MOXIFLOXACIN HCL 0.5 % OP SOLN
1.0000 [drp] | Freq: Once | OPHTHALMIC | Status: DC
  Filled 2018-11-13: qty 3

## 2018-11-13 MED ORDER — TETRACAINE HCL 0.5 % OP SOLN
1.0000 [drp] | OPHTHALMIC | Status: DC | PRN
  Administered 2018-11-13: 1 [drp] via OPHTHALMIC

## 2018-11-13 MED ORDER — ONDANSETRON HCL 4 MG/2ML IJ SOLN
INTRAMUSCULAR | Status: DC | PRN
  Administered 2018-11-13: 4 mg via INTRAVENOUS

## 2018-11-13 MED ORDER — NA HYALUR & NA CHOND-NA HYALUR 0.55-0.5 ML IO KIT
PACK | INTRAOCULAR | Status: DC | PRN
  Administered 2018-11-13: 1 via INTRAOCULAR

## 2018-11-13 MED ORDER — ARMC OPHTHALMIC DILATING DROPS
OPHTHALMIC | Status: AC
  Filled 2018-11-13: qty 0.5

## 2018-11-13 MED ORDER — NA HYALUR & NA CHOND-NA HYALUR 0.55-0.5 ML IO KIT
PACK | INTRAOCULAR | Status: AC
  Filled 2018-11-13: qty 1.05

## 2018-11-13 MED ORDER — GLYCOPYRROLATE 0.2 MG/ML IJ SOLN
INTRAMUSCULAR | Status: AC
  Administered 2018-11-13: 0.2 mg via INTRAVENOUS
  Filled 2018-11-13: qty 1

## 2018-11-13 MED ORDER — MOXIFLOXACIN HCL 0.5 % OP SOLN
OPHTHALMIC | Status: AC
  Filled 2018-11-13: qty 3

## 2018-11-13 MED ORDER — EPINEPHRINE PF 1 MG/ML IJ SOLN
INTRAOCULAR | Status: DC | PRN
  Administered 2018-11-13: 1 mL via OPHTHALMIC

## 2018-11-13 MED ORDER — FENTANYL CITRATE (PF) 100 MCG/2ML IJ SOLN
INTRAMUSCULAR | Status: DC | PRN
  Administered 2018-11-13 (×2): 25 ug via INTRAVENOUS

## 2018-11-13 MED ORDER — DORZOLAMIDE HCL-TIMOLOL MAL 2-0.5 % OP SOLN
OPHTHALMIC | Status: DC | PRN
  Administered 2018-11-13: 2 [drp] via OPHTHALMIC

## 2018-11-13 MED ORDER — GLYCOPYRROLATE 0.2 MG/ML IJ SOLN
0.2000 mg | Freq: Once | INTRAMUSCULAR | Status: AC
  Administered 2018-11-13: 11:00:00 0.2 mg via INTRAVENOUS

## 2018-11-13 MED ORDER — POVIDONE-IODINE 5 % OP SOLN
OPHTHALMIC | Status: DC | PRN
  Administered 2018-11-13: 1 via OPHTHALMIC

## 2018-11-13 MED ORDER — FENTANYL CITRATE (PF) 100 MCG/2ML IJ SOLN
INTRAMUSCULAR | Status: AC
  Filled 2018-11-13: qty 2

## 2018-11-13 MED ORDER — MIDAZOLAM HCL 2 MG/2ML IJ SOLN
INTRAMUSCULAR | Status: AC
  Filled 2018-11-13: qty 2

## 2018-11-13 MED ORDER — CARBACHOL 0.01 % IO SOLN
INTRAOCULAR | Status: DC | PRN
  Administered 2018-11-13: .5 mL via INTRAOCULAR

## 2018-11-13 MED ORDER — GLYCOPYRROLATE 0.2 MG/ML IJ SOLN: 0.1000 mg | Freq: Once | INTRAMUSCULAR | Status: DC

## 2018-11-13 MED ORDER — ARMC OPHTHALMIC DILATING DROPS
1.0000 "application " | OPHTHALMIC | Status: AC
  Administered 2018-11-13 (×3): 1 via OPHTHALMIC

## 2018-11-13 MED ORDER — TETRACAINE HCL 0.5 % OP SOLN
OPHTHALMIC | Status: AC
  Filled 2018-11-13: qty 4

## 2018-11-13 MED ORDER — NA CHONDROIT SULF-NA HYALURON 40-17 MG/ML IO SOLN
INTRAOCULAR | Status: AC
  Filled 2018-11-13: qty 1

## 2018-11-13 MED ORDER — MIDAZOLAM HCL 2 MG/2ML IJ SOLN
INTRAMUSCULAR | Status: DC | PRN
  Administered 2018-11-13: 0.5 mg via INTRAVENOUS
  Administered 2018-11-13: 1 mg via INTRAVENOUS
  Administered 2018-11-13: 0.5 mg via INTRAVENOUS

## 2018-11-13 MED ORDER — TRYPAN BLUE 0.06 % OP SOLN
OPHTHALMIC | Status: DC | PRN
  Administered 2018-11-13: .5 mL via INTRAOCULAR

## 2018-11-13 MED ORDER — LIDOCAINE HCL (PF) 4 % IJ SOLN
INTRAMUSCULAR | Status: AC
  Filled 2018-11-13: qty 5

## 2018-11-13 MED ORDER — NA CHONDROIT SULF-NA HYALURON 40-17 MG/ML IO SOLN
INTRAOCULAR | Status: DC | PRN
  Administered 2018-11-13: 1 mL via INTRAOCULAR

## 2018-11-13 SURGICAL SUPPLY — 18 items
BNDG EYE OVAL (GAUZE/BANDAGES/DRESSINGS) ×4 IMPLANT
DISSECTOR HYDRO NUCLEUS 50X22 (MISCELLANEOUS) ×12 IMPLANT
DRSG TEGADERM 2-3/8X2-3/4 SM (GAUZE/BANDAGES/DRESSINGS) ×3 IMPLANT
GLOVE BIOGEL M 6.5 STRL (GLOVE) ×3 IMPLANT
GOWN STRL REUS W/ TWL LRG LVL3 (GOWN DISPOSABLE) ×1 IMPLANT
GOWN STRL REUS W/ TWL XL LVL3 (GOWN DISPOSABLE) ×1 IMPLANT
GOWN STRL REUS W/TWL LRG LVL3 (GOWN DISPOSABLE) ×3
GOWN STRL REUS W/TWL XL LVL3 (GOWN DISPOSABLE) ×3
KNIFE 45D UP 2.3 (MISCELLANEOUS) ×3 IMPLANT
LABEL CATARACT MEDS ST (LABEL) ×3 IMPLANT
LENS IOL TECNIS ITEC 20.5 (Intraocular Lens) ×2 IMPLANT
PACK CATARACT (MISCELLANEOUS) ×3 IMPLANT
PACK CATARACT KING (MISCELLANEOUS) ×3 IMPLANT
PACK EYE AFTER SURG (MISCELLANEOUS) ×3 IMPLANT
SOL BSS BAG (MISCELLANEOUS) ×3
SOLUTION BSS BAG (MISCELLANEOUS) ×1 IMPLANT
WATER STERILE IRR 250ML POUR (IV SOLUTION) ×3 IMPLANT
WIPE NON LINTING 3.25X3.25 (MISCELLANEOUS) ×3 IMPLANT

## 2018-11-13 NOTE — H&P (Signed)
   I have reviewed the patient's H&P and agree with its findings. There have been no interval changes.  Ellianna Ruest MD Ophthalmology 

## 2018-11-13 NOTE — Anesthesia Post-op Follow-up Note (Signed)
Anesthesia QCDR form completed.        

## 2018-11-13 NOTE — Op Note (Signed)
  PREOPERATIVE DIAGNOSIS:  Nuclear sclerotic cataract of the RIGHT eye.   POSTOPERATIVE DIAGNOSIS:  Nuclear sclerotic cataract of the RIGHT eye.   OPERATIVE PROCEDURE: Cataract surgery OD   SURGEON:  Marchia Meiers, MD.   ANESTHESIA:  Anesthesiologist: Alphonsus Sias, MD CRNA: Kelton Pillar, CRNA  1.      Managed anesthesia care. 2.     0.44ml of Shugarcaine was instilled following the paracentesis   COMPLICATIONS:  None.   TECHNIQUE:   Divide and conquer   DESCRIPTION OF PROCEDURE:  The patient was examined and consented in the preoperative holding area where the aforementioned topical anesthesia was applied to the RIGHT eye and then brought back to the Operating Room where the RIGHT eye was prepped and draped in the usual sterile ophthalmic fashion and a lid speculum was placed. A paracentesis was created with the side port blade, the anterior chamber was washed out with trypan blue to stain the anterior capsule, and the anterior chamber was filled with viscoelastic. A near clear corneal incision was performed with the steel keratome. A continuous curvilinear capsulorrhexis was performed with a cystotome followed by the capsulorrhexis forceps. Hydrodissection and hydrodelineation were carried out with BSS on a blunt cannula. The lens was removed in a divide and conquer  technique and the remaining cortical material was removed with the irrigation-aspiration handpiece. The capsular bag was inflated with viscoelastic and the lens was placed in the capsular bag without complication. The remaining viscoelastic was removed from the eye with the irrigation-aspiration handpiece. The wounds were hydrated. The anterior chamber was flushed and the eye was inflated to physiologic pressure. 0.60ml Vigamox was placed in the anterior chamber. The wounds were found to be water tight. The eye was dressed with Vigamox. The patient was given protective glasses to wear throughout the day and a shield  with which to sleep tonight. The patient was also given drops with which to begin a drop regimen today and will follow-up with me in one day. Implant Name Type Inv. Item Serial No. Manufacturer Lot No. LRB No. Used Action  LENS IOL DIOP 20.5 - [id number] 2001 Intraocular Lens LENS IOL DIOP 20.5 [id number] 2001 [place]  Right 1 Implanted    Procedure(s) with comments: CATARACT EXTRACTION PHACO AND INTRAOCULAR LENS PLACEMENT (IOC) RIGHT VISION BLUE (Right) - [name]   01:18 CDE 12.46 Fluid pack lot # [id number]  Electronically signed: Marchia Meiers 11/13/2018 1:51 PM

## 2018-11-13 NOTE — Anesthesia Postprocedure Evaluation (Signed)
Anesthesia Post Note  Patient: Hector Davies  Procedure(s) Performed: CATARACT EXTRACTION PHACO AND INTRAOCULAR LENS PLACEMENT (IOC) RIGHT VISION BLUE (Right Eye)  Patient location during evaluation: Phase II Anesthesia Type: MAC Level of consciousness: awake and alert Pain management: pain level controlled Vital Signs Assessment: post-procedure vital signs reviewed and stable Respiratory status: spontaneous breathing, nonlabored ventilation and respiratory function stable Cardiovascular status: blood pressure returned to baseline and stable Postop Assessment: no apparent nausea or vomiting Anesthetic complications: no     Last Vitals:  Vitals:   11/13/18 1004 11/13/18 1334  BP: 131/83 (!) 146/89  Pulse: (!) 58 (!) 56  Resp: 16 16  Temp: 36.6 C (!) 36.4 C  SpO2: 100% 98%    Last Pain:  Vitals:   11/13/18 1334  TempSrc: Temporal  PainSc: 0-No pain                 Alphonsus Sias

## 2018-11-13 NOTE — Transfer of Care (Signed)
Immediate Anesthesia Transfer of Care Note  Patient: Hector Davies  Procedure(s) Performed: CATARACT EXTRACTION PHACO AND INTRAOCULAR LENS PLACEMENT (IOC) RIGHT VISION BLUE (Right Eye)  Patient Location: PACU  Anesthesia Type:MAC  Level of Consciousness: awake, alert , oriented and patient cooperative  Airway & Oxygen Therapy: Patient Spontanous Breathing  Post-op Assessment: Report given to RN and Post -op Vital signs reviewed and stable  Post vital signs: Reviewed and stable  Last Vitals:  Vitals Value Taken Time  BP    Temp 36.4 C 11/13/18 1334  Pulse 56 11/13/18 1334  Resp 16 11/13/18 1334  SpO2 98 % 11/13/18 1334    Last Pain:  Vitals:   11/13/18 1334  TempSrc: Temporal  PainSc: 0-No pain         Complications: No apparent anesthesia complications

## 2018-11-13 NOTE — Discharge Instructions (Addendum)
Eye Surgery Discharge Instructions  Expect mild scratchy sensation or mild soreness. DO NOT RUB YOUR EYE!  The day of surgery:  Minimal physical activity, but bed rest is not required  No reading, computer work, or close hand work  No bending, lifting, or straining.  May watch TV  For 24 hours:  No driving, legal decisions, or alcoholic beverages  Safety precautions  Eat anything you prefer: It is better to start with liquids, then soup then solid foods.  Solar shield eyeglasses should be worn for comfort in the sunlight/patch while sleeping  Resume all regular medications including aspirin or Coumadin if these were discontinued prior to surgery. You may shower, bathe, shave, or wash your hair. Tylenol may be taken for mild discomfort. Follow eye drop instruction sheet as reviewed.  Call your doctor if you experience significant pain, nausea, or vomiting, fever > 101 or other signs of infection. [phone] or [phone] Specific instructions:  Follow-up Information    Marchia Meiers, MD Follow up.   Specialty: Ophthalmology Why: 11-14-18 @ 8:50 am  Contact information: [place]  [id number] [phone]

## 2018-11-13 NOTE — Anesthesia Preprocedure Evaluation (Signed)
Anesthesia Evaluation  Patient identified by MRN, date of birth, ID band Patient awake    Reviewed: Allergy & Precautions, H&P , NPO status , reviewed documented beta blocker date and time   Airway Mallampati: II   Neck ROM: full    Dental  (+) Upper Dentures, Lower Dentures   Pulmonary COPD,  COPD inhaler, Current Smoker and Patient abstained from smoking.,  Chronic cough   Pulmonary exam normal        Cardiovascular Normal cardiovascular exam     Neuro/Psych PSYCHIATRIC DISORDERS Anxiety Depression    GI/Hepatic PUD, GERD  Medicated and Controlled,  Endo/Other    Renal/GU      Musculoskeletal   Abdominal   Peds  Hematology   Anesthesia Other Findings Past Medical History: No date: Anxiety No date: COPD (chronic obstructive pulmonary disease) (HCC) No date: Depression No date: Gastric ulcer No date: Hypercholesterolemia  Past Surgical History: No date: Dental w N2O   BMI    Body Mass Index: 26.98 kg/m      Reproductive/Obstetrics                             Anesthesia Physical Anesthesia Plan  ASA: III  Anesthesia Plan: MAC   Post-op Pain Management:    Induction: Intravenous  PONV Risk Score and Plan: 1 and Midazolam  Airway Management Planned: Nasal Cannula and Natural Airway  Additional Equipment:   Intra-op Plan:   Post-operative Plan:   Informed Consent: I have reviewed the patients History and Physical, chart, labs and discussed the procedure including the risks, benefits and alternatives for the proposed anesthesia with the patient or authorized representative who has indicated his/her understanding and acceptance.     Dental Advisory Given  Plan Discussed with: CRNA  Anesthesia Plan Comments:         Anesthesia Quick Evaluation

## 2019-05-22 ENCOUNTER — Other Ambulatory Visit: Payer: Self-pay

## 2019-05-22 ENCOUNTER — Emergency Department (HOSPITAL_COMMUNITY): Payer: Medicaid Other

## 2019-05-22 ENCOUNTER — Emergency Department (HOSPITAL_COMMUNITY)
Admission: EM | Admit: 2019-05-22 | Discharge: 2019-05-22 | Disposition: A | Discharge status: Home / Self Care | Payer: Medicaid Other | Attending: Emergency Medicine | Admitting: Emergency Medicine

## 2019-05-22 ENCOUNTER — Encounter (HOSPITAL_COMMUNITY): Payer: Self-pay | Admitting: Emergency Medicine

## 2019-05-22 DIAGNOSIS — F17210 Nicotine dependence, cigarettes, uncomplicated: Secondary | ICD-10-CM | POA: Insufficient documentation

## 2019-05-22 DIAGNOSIS — J449 Chronic obstructive pulmonary disease, unspecified: Secondary | ICD-10-CM | POA: Diagnosis not present

## 2019-05-22 DIAGNOSIS — R55 Syncope and collapse: Secondary | ICD-10-CM

## 2019-05-22 DIAGNOSIS — Z79899 Other long term (current) drug therapy: Secondary | ICD-10-CM | POA: Insufficient documentation

## 2019-05-22 LAB — URINALYSIS, ROUTINE W REFLEX MICROSCOPIC
Bilirubin Urine: NEGATIVE
Glucose, UA: NEGATIVE mg/dL
Hgb urine dipstick: NEGATIVE
Ketones, ur: NEGATIVE mg/dL
Leukocytes,Ua: NEGATIVE
Nitrite: NEGATIVE
Protein, ur: NEGATIVE mg/dL
Specific Gravity, Urine: 1.016 (ref 1.005–1.030)
pH: 6 (ref 5.0–8.0)

## 2019-05-22 LAB — CBC
HCT: 46.9 % (ref 39.0–52.0)
Hemoglobin: 15.7 g/dL (ref 13.0–17.0)
MCH: 30.8 pg (ref 26.0–34.0)
MCHC: 33.5 g/dL (ref 30.0–36.0)
MCV: 92.1 fL (ref 80.0–100.0)
Platelets: 248 10*3/uL (ref 150–400)
RBC: 5.09 MIL/uL (ref 4.22–5.81)
RDW: 12.8 % (ref 11.5–15.5)
WBC: 10 10*3/uL (ref 4.0–10.5)
nRBC: 0 % (ref 0.0–0.2)

## 2019-05-22 LAB — BASIC METABOLIC PANEL
Anion gap: 13 (ref 5–15)
BUN: 13 mg/dL (ref 6–20)
CO2: 25 mmol/L (ref 22–32)
Calcium: 9.5 mg/dL (ref 8.9–10.3)
Chloride: 102 mmol/L (ref 98–111)
Creatinine, Ser: 0.98 mg/dL (ref 0.61–1.24)
GFR calc Af Amer: >60 mL/min (ref >60)
GFR calc non Af Amer: >60 mL/min (ref >60)
Glucose, Bld: 127 mg/dL — ABNORMAL HIGH (ref 70–99)
Potassium: 4.2 mmol/L (ref 3.5–5.1)
Sodium: 140 mmol/L (ref 135–145)

## 2019-05-22 LAB — TROPONIN I (HIGH SENSITIVITY): Troponin I (High Sensitivity): 5 ng/L (ref <18)

## 2019-05-22 LAB — CBG MONITORING, ED: Glucose-Capillary: 96 mg/dL (ref 70–99)

## 2019-05-22 NOTE — Discharge Instructions (Signed)
Your work-up in the emergency department today has been reassuring.  We believe that you have experienced vasovagal episodes as a result of your coughing spells.  Continue follow-up with your primary care doctor.  You may return to the ED for new or concerning symptoms.
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
## 2019-05-22 NOTE — ED Notes (Signed)
Patient verbalizes understanding of discharge instructions. Opportunity for questioning and answers were provided. Armband removed by staff, pt discharged from ED.  

## 2019-08-12 IMAGING — DX LUMBAR SPINE - 2-3 VIEW
3 series · 3 of 3 positions shown · non-contrast
Comparison: None.

CLINICAL DATA: 53-year-old male with a history of back injury

EXAM:
LUMBAR SPINE - 2-3 VIEW

[l-spine ap]
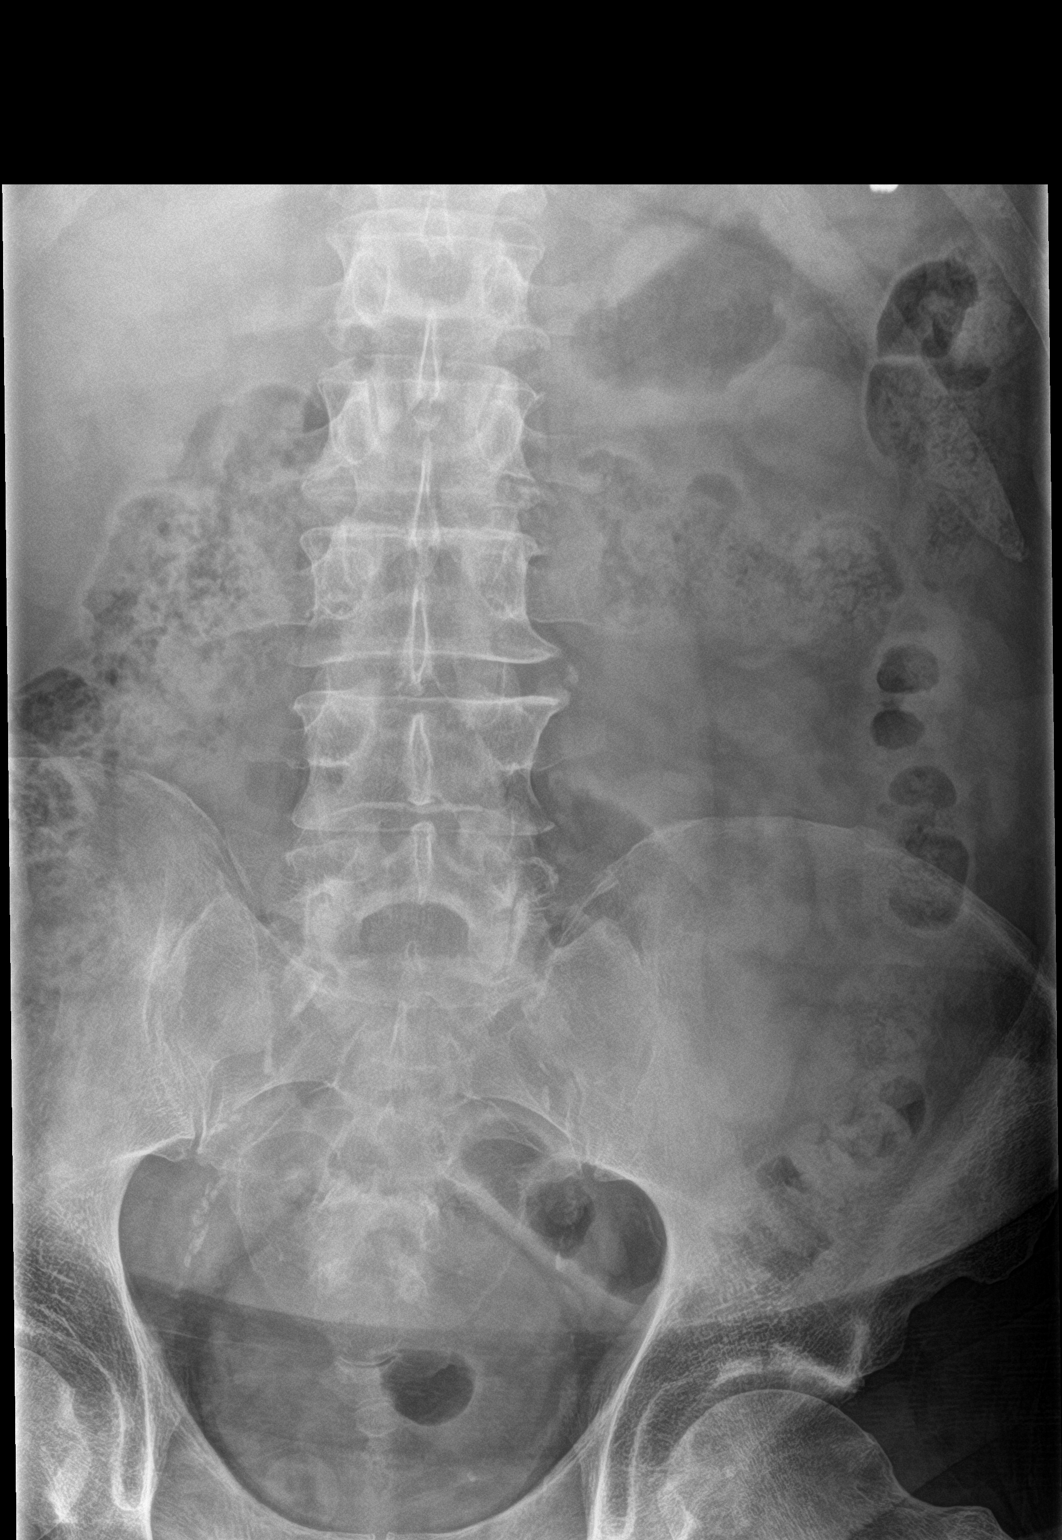

[l-spine lat]
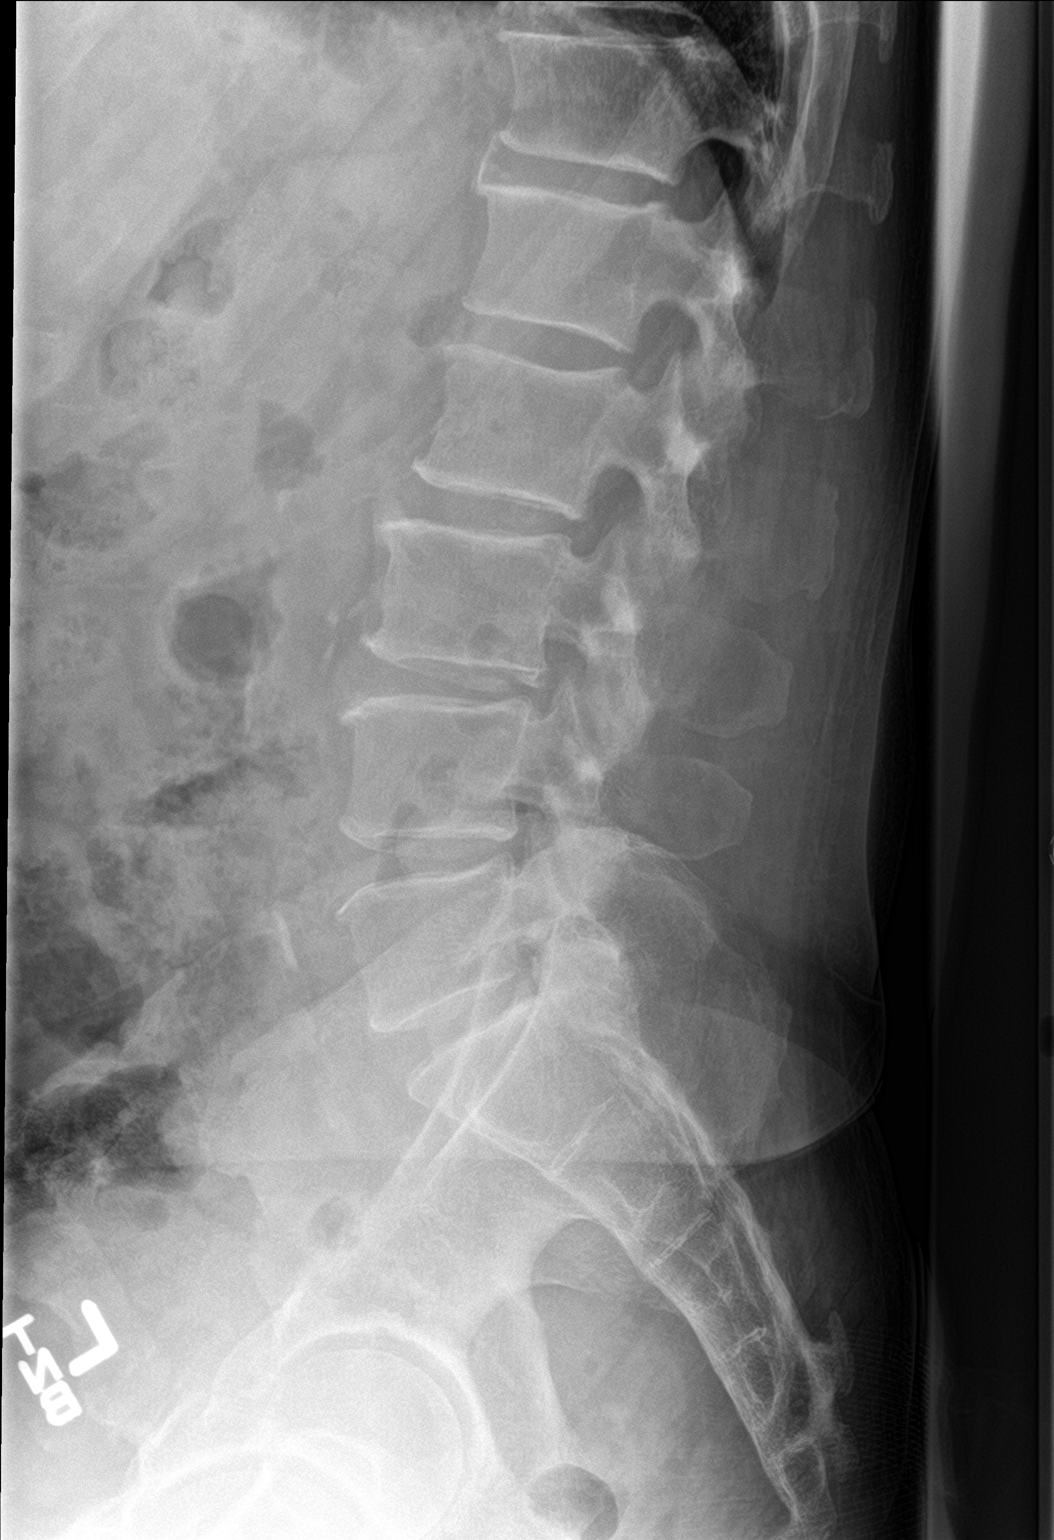

[l-spine spot]
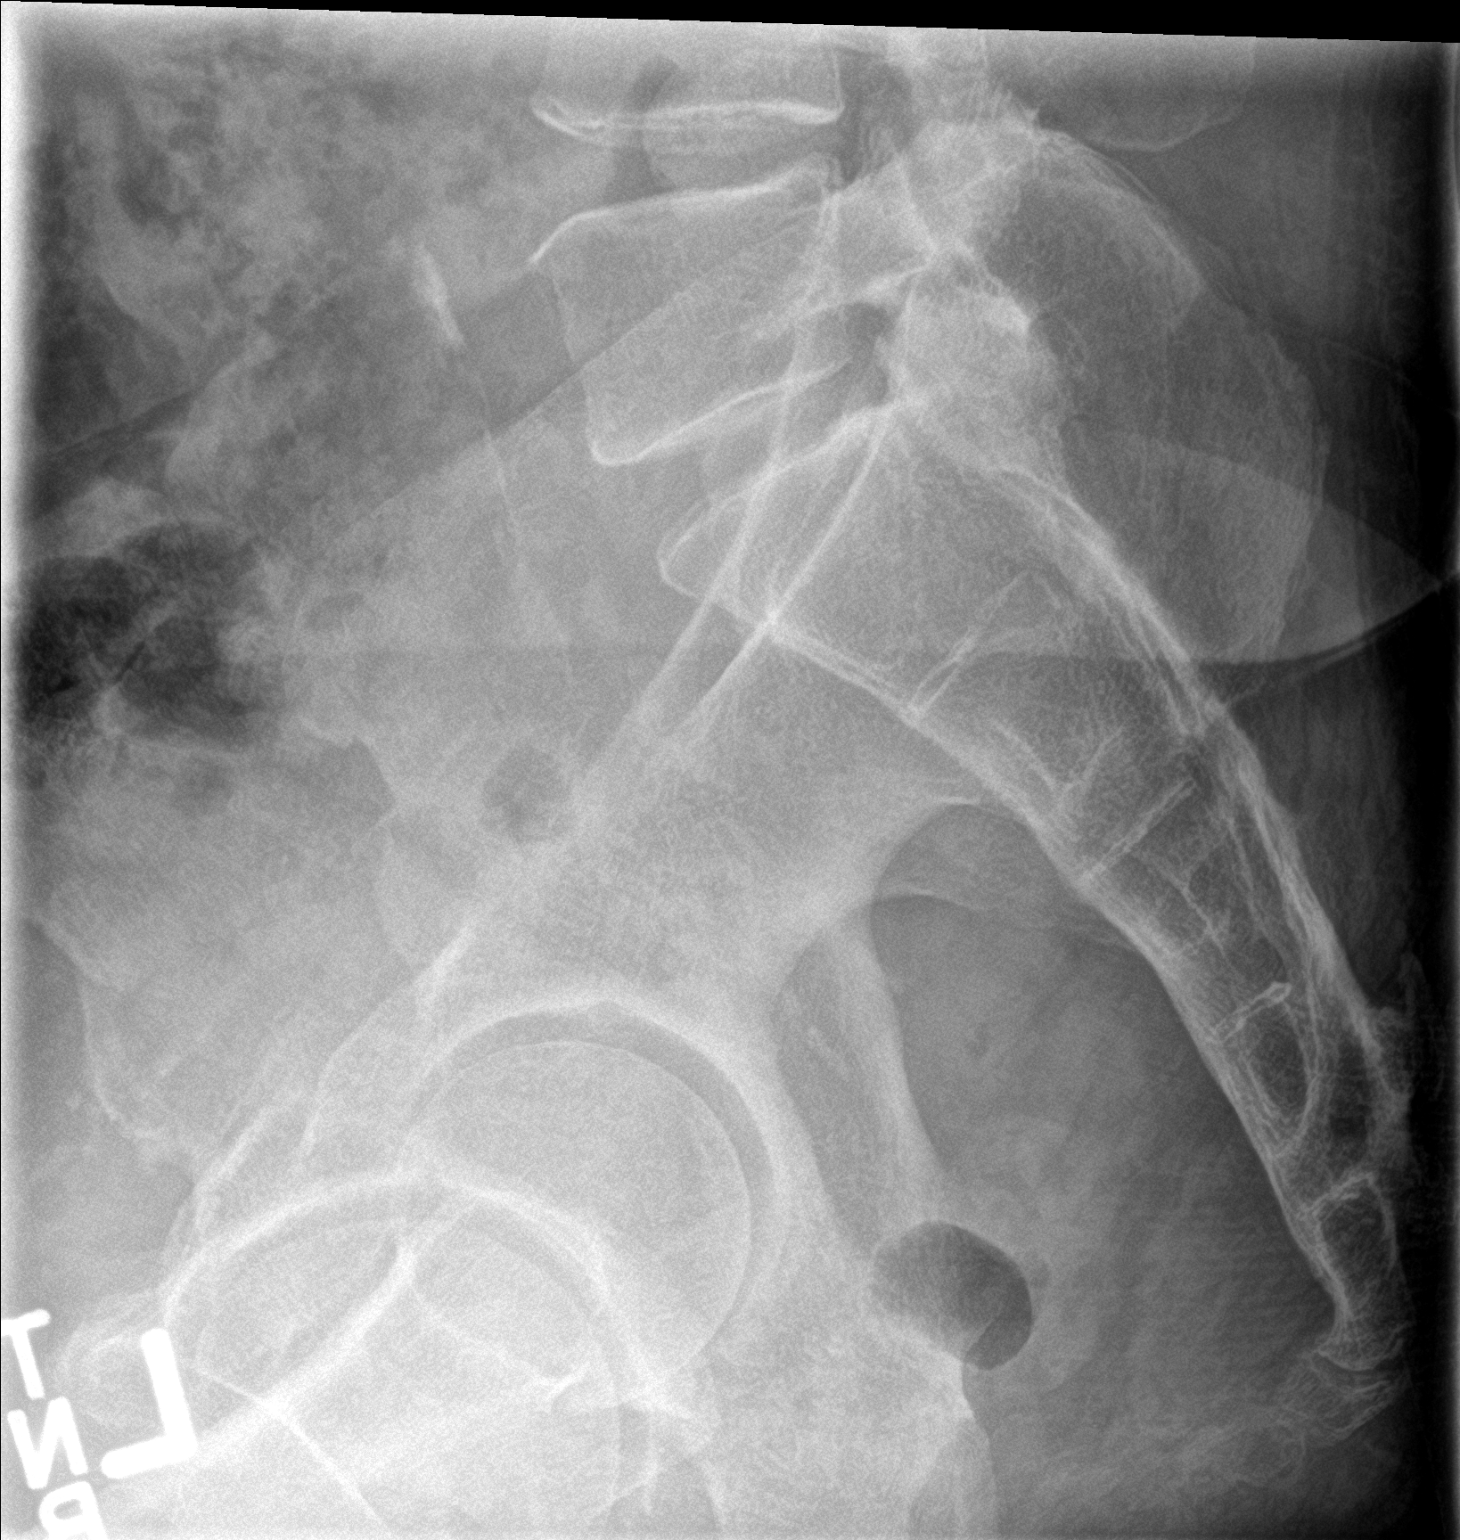

[3 of 3 positions shown; findings below may reference images not displayed]

FINDINGS: Lumbar Spine:

Lumbar vertebral elements maintain normal alignment without evidence
of anterolisthesis, retrolisthesis, subluxation.

No acute fracture line identified.

Vertebral body heights maintained.

Mild disc space narrowing throughout the lumbar spine with early
endplate sclerosis and anterior osteophyte production.

The pedicle lengths are decreased in the lower lumbar spine,
particularly at L4 and L5.

Questionable nondisplaced L5 pars defect.

Facet spurring at L4-L5 and L5-S1. Facet hypertrophy most pronounced
at L5-S1.
IMPRESSION: Negative for acute fracture or malalignment of the lumbar spine.

Congenital short pedicles of the lower lumbar spine.

Questionable L5 pars defect, nondisplaced.

Facet spurring at L4-L5 and L5-S1.

## 2020-03-04 ENCOUNTER — Other Ambulatory Visit: Payer: Medicaid Other

## 2020-03-04 DIAGNOSIS — Z20822 Contact with and (suspected) exposure to covid-19: Secondary | ICD-10-CM

## 2020-03-08 LAB — NOVEL CORONAVIRUS, NAA: SARS-CoV-2, NAA: DETECTED — AB

## 2020-06-04 ENCOUNTER — Other Ambulatory Visit: Payer: Self-pay

## 2020-06-04 ENCOUNTER — Emergency Department (HOSPITAL_COMMUNITY): Payer: Medicaid Other

## 2020-06-04 ENCOUNTER — Emergency Department (HOSPITAL_COMMUNITY)
Admission: EM | Admit: 2020-06-04 | Discharge: 2020-06-04 | Disposition: A | Discharge status: Home / Self Care | Payer: Medicaid Other | Attending: Emergency Medicine | Admitting: Emergency Medicine

## 2020-06-04 ENCOUNTER — Encounter (HOSPITAL_COMMUNITY): Payer: Self-pay | Admitting: Emergency Medicine

## 2020-06-04 DIAGNOSIS — Z7951 Long term (current) use of inhaled steroids: Secondary | ICD-10-CM | POA: Diagnosis not present

## 2020-06-04 DIAGNOSIS — M79672 Pain in left foot: Secondary | ICD-10-CM | POA: Insufficient documentation

## 2020-06-04 DIAGNOSIS — R202 Paresthesia of skin: Secondary | ICD-10-CM | POA: Insufficient documentation

## 2020-06-04 DIAGNOSIS — M79671 Pain in right foot: Secondary | ICD-10-CM | POA: Diagnosis not present

## 2020-06-04 DIAGNOSIS — R52 Pain, unspecified: Secondary | ICD-10-CM

## 2020-06-04 DIAGNOSIS — F17200 Nicotine dependence, unspecified, uncomplicated: Secondary | ICD-10-CM | POA: Diagnosis not present

## 2020-06-04 DIAGNOSIS — J449 Chronic obstructive pulmonary disease, unspecified: Secondary | ICD-10-CM | POA: Insufficient documentation

## 2020-06-04 LAB — CBC WITH DIFFERENTIAL/PLATELET
Abs Immature Granulocytes: 0.04 10*3/uL (ref 0.00–0.07)
Basophils Absolute: 0 10*3/uL (ref 0.0–0.1)
Basophils Relative: 0 %
Eosinophils Absolute: 0.1 10*3/uL (ref 0.0–0.5)
Eosinophils Relative: 1 %
HCT: 45.6 % (ref 39.0–52.0)
Hemoglobin: 15.5 g/dL (ref 13.0–17.0)
Immature Granulocytes: 0 %
Lymphocytes Relative: 12 %
Lymphs Abs: 1.1 10*3/uL (ref 0.7–4.0)
MCH: 30.8 pg (ref 26.0–34.0)
MCHC: 34 g/dL (ref 30.0–36.0)
MCV: 90.5 fL (ref 80.0–100.0)
Monocytes Absolute: 0.7 10*3/uL (ref 0.1–1.0)
Monocytes Relative: 8 %
Neutro Abs: 7.2 10*3/uL (ref 1.7–7.7)
Neutrophils Relative %: 79 %
Platelets: 248 10*3/uL (ref 150–400)
RBC: 5.04 MIL/uL (ref 4.22–5.81)
RDW: 13.2 % (ref 11.5–15.5)
WBC: 9.2 10*3/uL (ref 4.0–10.5)
nRBC: 0 % (ref 0.0–0.2)

## 2020-06-04 LAB — COMPREHENSIVE METABOLIC PANEL
ALT: 30 U/L (ref 0–44)
AST: 28 U/L (ref 15–41)
Albumin: 4 g/dL (ref 3.5–5.0)
Alkaline Phosphatase: 87 U/L (ref 38–126)
Anion gap: 11 (ref 5–15)
BUN: 11 mg/dL (ref 6–20)
CO2: 26 mmol/L (ref 22–32)
Calcium: 9.1 mg/dL (ref 8.9–10.3)
Chloride: 100 mmol/L (ref 98–111)
Creatinine, Ser: 0.84 mg/dL (ref 0.61–1.24)
GFR, Estimated: >60 mL/min (ref >60)
Glucose, Bld: 135 mg/dL — ABNORMAL HIGH (ref 70–99)
Potassium: 4.1 mmol/L (ref 3.5–5.1)
Sodium: 137 mmol/L (ref 135–145)
Total Bilirubin: 0.6 mg/dL (ref 0.3–1.2)
Total Protein: 7.4 g/dL (ref 6.5–8.1)

## 2020-06-04 MED ORDER — NAPROXEN 500 MG PO TABS: ORAL_TABLET | ORAL | 1 refills | Status: DC

## 2020-06-04 NOTE — ED Triage Notes (Signed)
Pt c/o of bilateral numbness in both feet and legs x 1 week.

## 2020-06-04 NOTE — ED Provider Notes (Signed)
[facility] EMERGENCY DEPARTMENT Provider Note   CSN: [id number] Arrival date & time: 06/04/20  1034     History Chief Complaint  Patient presents with  . Numbness    Hector Davies is a 55 y.o. male.  Patient complains of numbness and pain to both of his legs worse on the left side.  He states he stands all the time  The history is provided by the patient and medical records. No language interpreter was used.  Leg Pain Location:  Leg and foot Leg location:  R leg and L leg Pain details:    Quality:  Aching   Radiates to:  Does not radiate   Severity:  Moderate   Onset quality:  Sudden   Timing:  Constant   Progression:  Waxing and waning Chronicity:  New Associated symptoms: no back pain and no fatigue        Past Medical History:  Diagnosis Date  . Anxiety   . COPD (chronic obstructive pulmonary disease) (HCC)   . Depression   . Gastric ulcer   . Hypercholesterolemia     Patient Active Problem List   Diagnosis Date Noted  . ALLERGIC RHINITIS 03/29/2010    Past Surgical History:  Procedure Laterality Date  . CATARACT EXTRACTION W/PHACO Right 11/13/2018   Procedure: CATARACT EXTRACTION PHACO AND INTRAOCULAR LENS PLACEMENT (IOC) RIGHT VISION BLUE;  Surgeon: Elliot Cousin, MD;  Location: [facility] ORS;  Service: Ophthalmology;  Laterality: Right;  [name]   01:18 CDE 12.46 Fluid pack lot # [id number]  . NO PAST SURGERIES         History reviewed. No pertinent family history.  Social History   Tobacco Use  . Smoking status: Current Every Day Smoker  . Smokeless tobacco: Never Used  Vaping Use  . Vaping Use: Never used  Substance Use Topics  . Alcohol use: Yes    Comment: every couple weeks  . Drug use: No    Home Medications Prior to Admission medications   Medication Sig Start Date End Date Taking? Authorizing Provider  naproxen (NAPROSYN) 500 MG tablet Take 1 pill twice a day as needed for pain and numbness 06/04/20  Yes Bethann Berkshire, MD  albuterol  (VENTOLIN HFA) 108 (90 Base) MCG/ACT inhaler Inhale 2 puffs into the lungs 4 (four) times daily as needed. 05/29/18   [provider]  Calcium Citrate-Vitamin D (CALCIUM + D PO) Take 1 tablet by mouth daily.     [provider]  citalopram (CELEXA) 20 MG tablet Take 20 mg by mouth daily.    [provider]  Glycopyrrolate-Formoterol (BEVESPI AEROSPHERE IN) Inhale 1 puff into the lungs 2 (two) times daily.    [provider]  Multiple Vitamin (MULTIVITAMIN WITH MINERALS) TABS tablet Take 1 tablet by mouth daily.    [provider]  SPIRIVA RESPIMAT 2.5 MCG/ACT AERS Inhale 2 sprays into the lungs daily. 05/29/18   [provider]  traZODone (DESYREL) 100 MG tablet Take 100 mg by mouth at bedtime.    [provider]    Allergies    Patient has no known allergies.  Review of Systems   Review of Systems  Constitutional: Negative for appetite change and fatigue.  HENT: Negative for congestion, ear discharge and sinus pressure.   Eyes: Negative for discharge.  Respiratory: Negative for cough.   Cardiovascular: Negative for chest pain.  Gastrointestinal: Negative for abdominal pain and diarrhea.  Genitourinary: Negative for frequency and hematuria.  Musculoskeletal: Negative for back  pain.       Bilateral lower leg pain  Skin: Negative for rash.  Neurological: Negative for seizures and headaches.  Psychiatric/Behavioral: Negative for hallucinations.    Physical Exam Updated Vital Signs BP (!) 148/79   Pulse 63   Temp 98.1 F (36.7 C)   Resp (!) 23   Ht 5\' 7"  (1.702 m)   Wt 88.5 kg   SpO2 100%   BMI 30.54 kg/m   Physical Exam Vitals reviewed.  Constitutional:      Appearance: He is well-developed.  HENT:     Head: Normocephalic.     Nose: Nose normal.  Eyes:     General: No scleral icterus.    Conjunctiva/sclera: Conjunctivae normal.  Neck:     Thyroid: No thyromegaly.  Cardiovascular:     Rate and Rhythm: Normal  rate and regular rhythm.     Heart sounds: No murmur heard. No friction rub. No gallop.   Pulmonary:     Breath sounds: No stridor. No wheezing or rales.  Chest:     Chest wall: No tenderness.  Abdominal:     General: There is no distension.     Tenderness: There is no abdominal tenderness. There is no rebound.  Musculoskeletal:        General: Normal range of motion.     Cervical back: Neck supple.  Lymphadenopathy:     Cervical: No cervical adenopathy.  Skin:    Findings: No erythema or rash.  Neurological:     Mental Status: He is alert and oriented to person, place, and time.     Motor: No abnormal muscle tone.     Coordination: Coordination normal.  Psychiatric:        Behavior: Behavior normal.     ED Results / Procedures / Treatments   Labs (all labs ordered are listed, but only abnormal results are displayed) Labs Reviewed  COMPREHENSIVE METABOLIC PANEL - Abnormal; Notable for the following components:      Result Value   Glucose, Bld 135 (*)    All other components within normal limits  CBC WITH DIFFERENTIAL/PLATELET    EKG None  Radiology DG Lumbar Spine Complete  Result Date: 06/04/2020 CLINICAL DATA:  Weakness with bilateral numbness in both feet. EXAM: LUMBAR SPINE - COMPLETE 4+ VIEW COMPARISON:  None. FINDINGS: No evidence for an acute fracture. No subluxation. Mild loss of disc height at L3-4 and L5-S1. SI joints unremarkable. IMPRESSION: No acute bony abnormality. Electronically Signed   By: 08/04/2020 M.D.   On: 06/04/2020 12:06   08/04/2020 ARTERIAL ABI (SCREENING LOWER EXTREMITY)  Result Date: 06/04/2020 CLINICAL DATA:  55 year old male with lower extremity pain. EXAM: NONINVASIVE PHYSIOLOGIC VASCULAR STUDY OF BILATERAL LOWER EXTREMITIES TECHNIQUE: Evaluation of both lower extremities were performed at rest, including calculation of ankle-brachial indices with single level Doppler, pressure and pulse volume recording. COMPARISON:  None. FINDINGS: Right ABI:   1.20 Left ABI:  1.38 Right Lower Extremity:  Normal arterial waveforms at the ankle. Left Lower Extremity:  Normal arterial waveforms at the ankle. IMPRESSION: Normal ankle-brachial indices. No evidence of significant bilateral lower extremity peripheral artery disease. 56, MD [facility] Radiology Electronically Signed   By: [facility] MD   On: 06/04/2020 12:01    Procedures Procedures   Medications Ordered in ED Medications - No data to display  ED Course  I have reviewed the triage vital signs and the nursing notes.  Pertinent labs & imaging  results that were available during my care of the patient were reviewed by me and considered in my medical decision making (see chart for details).    MDM Rules/Calculators/A&P                         Labs unremarkable.  X-ray of the back shows some disc narrowing.  Vascular study of legs normal.  Patient will be placed on Naprosyn will follow up with PCP Final Clinical Impression(s) / ED Diagnoses Final diagnoses:  Pain  Paresthesia    Rx / DC Orders ED Discharge Orders         Ordered    naproxen (NAPROSYN) 500 MG tablet        06/04/20 1331           Bethann Berkshire, MD 06/04/20 1334

## 2020-06-04 NOTE — Discharge Instructions (Addendum)
Follow-up with your family doctor next week for recheck. 

## 2020-10-20 ENCOUNTER — Encounter: Payer: Self-pay | Admitting: Neurology

## 2020-12-26 ENCOUNTER — Ambulatory Visit: Payer: Medicaid Other | Admitting: Neurology

## 2020-12-26 ENCOUNTER — Encounter: Payer: Self-pay | Admitting: Neurology

## 2020-12-26 ENCOUNTER — Other Ambulatory Visit: Payer: Self-pay

## 2020-12-26 VITALS — BP 137/85 | HR 73 | Ht 70.0 in | Wt 214.0 lb

## 2020-12-26 DIAGNOSIS — M4807 Spinal stenosis, lumbosacral region: Secondary | ICD-10-CM

## 2020-12-26 DIAGNOSIS — R292 Abnormal reflex: Secondary | ICD-10-CM

## 2020-12-26 DIAGNOSIS — M48061 Spinal stenosis, lumbar region without neurogenic claudication: Secondary | ICD-10-CM | POA: Diagnosis not present

## 2020-12-26 DIAGNOSIS — R2681 Unsteadiness on feet: Secondary | ICD-10-CM

## 2020-12-26 NOTE — Progress Notes (Signed)
[facility] Neurology Division Clinic Note - Initial Visit   Date: 12/26/20  [name] MRN: [id number] DOB: [birth year]   Dear Dr. Lelan Pons, NP:  Thank you for your kind referral of Hector Davies for consultation of leg weakness. Although his history is well known to you, please allow [name] to reiterate it for the purpose of our medical record. The patient was accompanied to the clinic by daughter who also provides collateral information.     History of Present Illness: Hector Davies is a 55 y.o. left-handed male with hyperlipidemia, depression, COPD, anxiety, and tobacco use presenting for evaluation of bilateral leg weakness. Starting about 6 months ago, he began having leg weakness, which he describes more as imbalance.  He looses his balance all the time tends to fall backwards and cannot always break his falls.  He says that he falls daily.  He walks unassisted.  He complains of low back pain. No numbness/tingling of the legs.  He lives at home with three daughters.   He smokes 2PPD x 45 years, previously smoking 4 PPD. He has remote history of drinking 6-12 pack per day x 10 years, stopped this 10 years ago.  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     Out-side paper records, electronic medical record, and images have been reviewed where available and summarized as:  ABI 06/04/2020:  Normal ankle-brachial indices. No evidence of significant bilateral lower extremity peripheral artery disease.   Past Medical History:  Diagnosis Date   Anxiety    COPD (chronic obstructive pulmonary disease) (HCC)    Depression    Gastric ulcer    Hypercholesterolemia     Past Surgical History:  Procedure Laterality Date   CATARACT EXTRACTION W/PHACO Right 11/13/2018   Procedure: CATARACT EXTRACTION PHACO AND INTRAOCULAR LENS PLACEMENT (IOC) RIGHT VISION BLUE;  Surgeon: Elliot Cousin, MD;  Location: [facility] ORS;   Service: Ophthalmology;  Laterality: Right;  [name]   01:18 CDE 12.46 Fluid pack lot # [id number]   NO PAST SURGERIES       Medications:  Outpatient Encounter Medications as of 12/26/2020  Medication Sig   albuterol (VENTOLIN HFA) 108 (90 Base) MCG/ACT inhaler Inhale 2 puffs into the lungs 4 (four) times daily as needed.   cetirizine (ZYRTEC) 10 MG tablet Take 10 mg by mouth daily.   citalopram (CELEXA) 40 MG tablet Take 40 mg by mouth daily. 40 mg Daily   Glycopyrrolate-Formoterol (BEVESPI AEROSPHERE IN) Inhale 1 puff into the lungs 2 (two) times daily.   Multiple Vitamin (MULTIVITAMIN WITH MINERALS) TABS tablet Take 1 tablet by mouth daily.   naproxen (NAPROSYN) 500 MG tablet Take 1 pill twice a day as needed for pain and numbness   SPIRIVA RESPIMAT 2.5 MCG/ACT AERS Inhale 2 sprays into the lungs daily.   Calcium Citrate-Vitamin D (CALCIUM + D PO) Take 1 tablet by mouth daily.  (Patient not taking: Reported on 12/26/2020)   [DISCONTINUED] traZODone (DESYREL) 100 MG tablet Take 100 mg by mouth at bedtime. (Patient not taking: Reported on 12/26/2020)   No facility-administered encounter medications on file as of 12/26/2020.    Allergies: No Known Allergies  Family History: Family History  Problem Relation Age of Onset   Uterine cancer Mother    Stroke Father     Social History: Social History   Tobacco Use   Smoking status: Every Day   Smokeless tobacco: Never  Vaping Use  Vaping Use: Never used  Substance Use Topics   Alcohol use: Yes    Comment: every couple weeks   Drug use: No   Social History   Social History Narrative   Left Handed    Lives in a one story home     Vital Signs:  BP 137/85   Pulse 73   Ht 5\' 10"  (1.778 m)   Wt 214 lb (97.1 kg)   SpO2 98%   BMI 30.71 kg/m   Neurological Exam: MENTAL STATUS including orientation to time, place, person, recent and remote memory, attention span and concentration, language, and fund of knowledge is normal.   Speech is not dysarthric.  CRANIAL NERVES: II:  No visual field defects.    III-IV-VI: Pupils equal round and reactive to light.  Normal conjugate, extra-ocular eye movements in all directions of gaze.  No nystagmus.  No ptosis.   V:  Normal facial sensation.    VII:  Normal facial symmetry and movements.   VIII:  Normal hearing and vestibular function.   IX-X:  Normal palatal movement.   XI:  Normal shoulder shrug and head rotation.   XII:  Normal tongue strength and range of motion, no deviation or fasciculation.  MOTOR:  No atrophy, fasciculations or abnormal movements.  No pronator drift.   Upper Extremity:  Right  Left  Deltoid  5/5   5/5   Biceps  5/5   5/5   Triceps  5/5   5/5   Infraspinatus 5/5  5/5  Medial pectoralis 5/5  5/5  Wrist extensors  5/5   5/5   Wrist flexors  5/5   5/5   Finger extensors  5/5   5/5   Finger flexors  5/5   5/5   Dorsal interossei  5/5   5/5   Abductor pollicis  5/5   5/5   Tone (Ashworth scale)  0  0   Lower Extremity:  Right  Left  Hip flexors  5/5   5/5   Hip extensors  5/5   5/5   Adductor 5/5  5/5  Abductor 5/5  5/5  Knee flexors  5/5   5/5   Knee extensors  5/5   5/5   Dorsiflexors  5/5   5/5   Plantarflexors  5/5   5/5   Toe extensors  5/5   5/5   Toe flexors  5/5   5/5   Tone (Ashworth scale)  0  0   MSRs:  Right        Left                  brachioradialis 2+  2+  biceps 2+  2+  triceps 2+  2+  patellar 3+  3+  ankle jerk 3+  3+  Hoffman no  no  plantar response up  up   SENSORY:  Normal and symmetric perception of light touch, pinprick, vibration, and proprioception.  Romberg's sign absent.   COORDINATION/GAIT: Normal finger-to- nose-finger.  Intact rapid alternating movements bilaterally.  Able to rise from a chair without using Davies.  Gait mildly wide-based and stable, unassisted. Tandem and stressed gait intact.    IMPRESSION: Gait imbalance and falls.  Exam shows brisk reflexes in the legs which raises concern  for lumbar canal stenosis without neurogenic claudication.  - MRI lumbar spine wo contrast to look for compressive pathology  - Start PT for gait training  Further recommendations pending results.   Thank you for  allowing me to participate in patient's care.  If I can answer any additional questions, I would be pleased to do so.    Sincerely,    Arly Salminen K. Posey Pronto, DO

## 2020-12-26 NOTE — Patient Instructions (Signed)
MRI lumbar spine without contrast.  We will inform you of the results and decide the next step.  Start physical therapy for gait training

## 2020-12-28 ENCOUNTER — Emergency Department: Payer: Medicaid Other

## 2020-12-28 ENCOUNTER — Other Ambulatory Visit: Payer: Self-pay

## 2020-12-28 ENCOUNTER — Encounter: Payer: Self-pay | Admitting: *Deleted

## 2020-12-28 DIAGNOSIS — R531 Weakness: Secondary | ICD-10-CM | POA: Diagnosis not present

## 2020-12-28 DIAGNOSIS — Z79899 Other long term (current) drug therapy: Secondary | ICD-10-CM | POA: Insufficient documentation

## 2020-12-28 DIAGNOSIS — Z20822 Contact with and (suspected) exposure to covid-19: Secondary | ICD-10-CM | POA: Diagnosis not present

## 2020-12-28 DIAGNOSIS — Z7951 Long term (current) use of inhaled steroids: Secondary | ICD-10-CM | POA: Insufficient documentation

## 2020-12-28 DIAGNOSIS — W19XXXA Unspecified fall, initial encounter: Secondary | ICD-10-CM | POA: Diagnosis not present

## 2020-12-28 DIAGNOSIS — J449 Chronic obstructive pulmonary disease, unspecified: Secondary | ICD-10-CM | POA: Insufficient documentation

## 2020-12-28 DIAGNOSIS — M545 Low back pain, unspecified: Secondary | ICD-10-CM | POA: Insufficient documentation

## 2020-12-28 DIAGNOSIS — R296 Repeated falls: Secondary | ICD-10-CM | POA: Insufficient documentation

## 2020-12-28 DIAGNOSIS — F17200 Nicotine dependence, unspecified, uncomplicated: Secondary | ICD-10-CM | POA: Insufficient documentation

## 2020-12-28 LAB — CBC
HCT: 41.5 % (ref 39.0–52.0)
Hemoglobin: 14.2 g/dL (ref 13.0–17.0)
MCH: 30.4 pg (ref 26.0–34.0)
MCHC: 34.2 g/dL (ref 30.0–36.0)
MCV: 88.9 fL (ref 80.0–100.0)
Platelets: 228 10*3/uL (ref 150–400)
RBC: 4.67 MIL/uL (ref 4.22–5.81)
RDW: 12.7 % (ref 11.5–15.5)
WBC: 8.6 10*3/uL (ref 4.0–10.5)
nRBC: 0 % (ref 0.0–0.2)

## 2020-12-28 LAB — BASIC METABOLIC PANEL
Anion gap: 6 (ref 5–15)
BUN: 17 mg/dL (ref 6–20)
CO2: 27 mmol/L (ref 22–32)
Calcium: 8.8 mg/dL — ABNORMAL LOW (ref 8.9–10.3)
Chloride: 102 mmol/L (ref 98–111)
Creatinine, Ser: 0.84 mg/dL (ref 0.61–1.24)
GFR, Estimated: >60 mL/min (ref >60)
Glucose, Bld: 116 mg/dL — ABNORMAL HIGH (ref 70–99)
Potassium: 4 mmol/L (ref 3.5–5.1)
Sodium: 135 mmol/L (ref 135–145)

## 2020-12-28 LAB — TROPONIN I (HIGH SENSITIVITY): Troponin I (High Sensitivity): 5 ng/L (ref <18)

## 2020-12-28 NOTE — ED Triage Notes (Signed)
Pt reports multiple falls recently.  States legs are giving out.  No chest pain or sob. Pt alert  speech clear.

## 2020-12-29 ENCOUNTER — Emergency Department: Payer: Medicaid Other

## 2020-12-29 ENCOUNTER — Telehealth: Payer: Self-pay | Admitting: Orthopedic Surgery

## 2020-12-29 ENCOUNTER — Emergency Department
Admission: EM | Admit: 2020-12-29 | Discharge: 2020-12-29 | Disposition: A | Discharge status: Home / Self Care | Payer: Medicaid Other | Attending: Emergency Medicine | Admitting: Emergency Medicine

## 2020-12-29 DIAGNOSIS — M48061 Spinal stenosis, lumbar region without neurogenic claudication: Secondary | ICD-10-CM

## 2020-12-29 DIAGNOSIS — R296 Repeated falls: Secondary | ICD-10-CM

## 2020-12-29 DIAGNOSIS — M48062 Spinal stenosis, lumbar region with neurogenic claudication: Secondary | ICD-10-CM

## 2020-12-29 LAB — RESP PANEL BY RT-PCR (FLU A&B, COVID) ARPGX2
Influenza A by PCR: NEGATIVE
Influenza B by PCR: NEGATIVE
SARS Coronavirus 2 by RT PCR: NEGATIVE

## 2020-12-29 LAB — URINALYSIS, ROUTINE W REFLEX MICROSCOPIC
Bacteria, UA: NONE SEEN
Bilirubin Urine: NEGATIVE
Glucose, UA: NEGATIVE mg/dL
Ketones, ur: NEGATIVE mg/dL
Leukocytes,Ua: NEGATIVE
Nitrite: NEGATIVE
Protein, ur: NEGATIVE mg/dL
Specific Gravity, Urine: 1.024 (ref 1.005–1.030)
pH: 5 (ref 5.0–8.0)

## 2020-12-29 LAB — URINE DRUG SCREEN, QUALITATIVE (ARMC ONLY)
Amphetamines, Ur Screen: NOT DETECTED
Barbiturates, Ur Screen: NOT DETECTED
Benzodiazepine, Ur Scrn: NOT DETECTED
Cannabinoid 50 Ng, Ur ~~LOC~~: NOT DETECTED
Cocaine Metabolite,Ur ~~LOC~~: NOT DETECTED
MDMA (Ecstasy)Ur Screen: NOT DETECTED
Methadone Scn, Ur: NOT DETECTED
Opiate, Ur Screen: NOT DETECTED
Phencyclidine (PCP) Ur S: NOT DETECTED
Tricyclic, Ur Screen: NOT DETECTED

## 2020-12-29 LAB — TROPONIN I (HIGH SENSITIVITY): Troponin I (High Sensitivity): 5 ng/L (ref <18)

## 2020-12-29 NOTE — ED Provider Notes (Signed)
Emergency department handoff note  Care of this patient was signed out to me at the end of the previous provider shift.  All pertinent patient information was conveyed and all questions were answered.  Patient pending results of lumbar and brain MRI showing multiple level lumbar spinal stenosis as well as neuroforaminal stenosis likely explaining patient's persistent weakness and falls.  Patient also has bilateral mastoid effusions.  The patient has been reexamined and is ready to be discharged.  All diagnostic results have been reviewed and discussed with the patient/family.  Care plan has been outlined and the patient/family understands all current diagnoses, results, and treatment plans.  There are no new complaints, changes, or physical findings at this time.  All questions have been addressed and answered.  Patient was instructed to, and agrees to follow-up with their primary care physician and orthopedic surgeon as well as return to the emergency department if any new or worsening symptoms develop.   Merwyn Katos, MD 12/29/20 [phone]

## 2020-12-29 NOTE — ED Provider Notes (Signed)
[facility] Emergency Department Provider Note   ____________________________________________   Event Date/Time   First MD Initiated Contact with Patient 12/29/20 [phone]     (approximate)  I have reviewed the triage vital signs and the nursing notes.   HISTORY  Chief Complaint Fall    HPI Hector Davies is a 55 y.o. male who presents to the ED from home with a chief complaint of frequent falls.  States legs are giving out and he has daily falls.  Patient was seen by [facility] neurology on 12/26/2020 for this and is scheduled for MRI lumbar spine next week.  Patient reports around 6 months ago he began to experience bilateral leg weakness which he describes more as imbalance.  Complains of low back pain.  Denies unilateral extremity weakness/numbness or tingling.  Denies bowel or bladder incontinence.  Denies fever, cough, chest pain, shortness of breath, abdominal pain, nausea, vomiting or diarrhea.  Denies altered mentation, slurred speech or facial droop.      Past Medical History:  Diagnosis Date   Anxiety    COPD (chronic obstructive pulmonary disease) (HCC)    Depression    Gastric ulcer    Hypercholesterolemia     Patient Active Problem List   Diagnosis Date Noted   ALLERGIC RHINITIS 03/29/2010    Past Surgical History:  Procedure Laterality Date   CATARACT EXTRACTION W/PHACO Right 11/13/2018   Procedure: CATARACT EXTRACTION PHACO AND INTRAOCULAR LENS PLACEMENT (IOC) RIGHT VISION BLUE;  Surgeon: Elliot Cousin, MD;  Location: [facility] ORS;  Service: Ophthalmology;  Laterality: Right;  [name]   01:18 CDE 12.46 Fluid pack lot # [id number]   NO PAST SURGERIES      Prior to Admission medications   Medication Sig Start Date End Date Taking? Authorizing Provider  albuterol (VENTOLIN HFA) 108 (90 Base) MCG/ACT inhaler Inhale 2 puffs into the lungs 4 (four) times daily as needed. 05/29/18   [provider]  Calcium Citrate-Vitamin D (CALCIUM + D PO)  Take 1 tablet by mouth daily.  Patient not taking: Reported on 12/26/2020    [provider]  cetirizine (ZYRTEC) 10 MG tablet Take 10 mg by mouth daily. 12/14/20   [provider]  citalopram (CELEXA) 40 MG tablet Take 40 mg by mouth daily. 40 mg Daily    [provider]  Glycopyrrolate-Formoterol (BEVESPI AEROSPHERE IN) Inhale 1 puff into the lungs 2 (two) times daily.    [provider]  Multiple Vitamin (MULTIVITAMIN WITH MINERALS) TABS tablet Take 1 tablet by mouth daily.    [provider]  naproxen (NAPROSYN) 500 MG tablet Take 1 pill twice a day as needed for pain and numbness 06/04/20   Bethann Berkshire, MD  SPIRIVA RESPIMAT 2.5 MCG/ACT AERS Inhale 2 sprays into the lungs daily. 05/29/18   [provider]    Allergies Patient has no known allergies.  Family History  Problem Relation Age of Onset   Uterine cancer Mother    Stroke Father     Social History Social History   Tobacco Use   Smoking status: Every Day   Smokeless tobacco: Never  Vaping Use   Vaping Use: Never used  Substance Use Topics   Alcohol use: Yes    Comment: every couple weeks   Drug use: No    Review of Systems  Constitutional: No fever/chills Eyes: No visual changes. ENT: No sore throat. Cardiovascular: Denies chest pain. Respiratory: Denies shortness of breath. Gastrointestinal: No abdominal pain.  No nausea,  no vomiting.  No diarrhea.  No constipation. Genitourinary: Negative for dysuria. Musculoskeletal: Positive for back pain. Skin: Negative for rash. Neurological: Positive for gait instability.  Negative for headaches, focal weakness or numbness.   ____________________________________________   PHYSICAL EXAM:  VITAL SIGNS: ED Triage Vitals [12/28/20 2245]  Enc Vitals Group     BP (!) 151/92     Pulse Rate 74     Resp 20     Temp 98.1 F (36.7 C)     Temp Source Oral     SpO2 96 %     Weight 214 lb (97.1 kg)     Height 5\' 9"   (1.753 m)     Head Circumference      Peak Flow      Pain Score 5     Pain Loc      Pain Edu?      Excl. in GC?     Constitutional: Alert and oriented. Well appearing and in no acute distress. Eyes: Conjunctivae are normal. PERRL. EOMI. Head: Atraumatic. Nose: No congestion/rhinnorhea. Mouth/Throat: Mucous membranes are moist.   Neck: No stridor.   Cardiovascular: Normal rate, regular rhythm. Grossly normal heart sounds.  Good peripheral circulation. Respiratory: Normal respiratory effort.  No retractions. Lungs CTAB. Gastrointestinal: Soft and nontender. No distention. No abdominal bruits. No CVA tenderness. Musculoskeletal: No spinal tenderness to palpation.  No lower extremity tenderness nor edema.  No joint effusions. Neurologic:  Normal speech and language. No gross focal neurologic deficits are appreciated.  Noted to be using rolling walker with mildly wide-based gait. Skin:  Skin is warm, dry and intact. No rash noted. Psychiatric: Mood and affect are normal. Speech and behavior are normal.  ____________________________________________   LABS (all labs ordered are listed, but only abnormal results are displayed)  Labs Reviewed  BASIC METABOLIC PANEL - Abnormal; Notable for the following components:      Result Value   Glucose, Bld 116 (*)    Calcium 8.8 (*)    All other components within normal limits  URINALYSIS, ROUTINE W REFLEX MICROSCOPIC - Abnormal; Notable for the following components:   Color, Urine YELLOW (*)    APPearance CLEAR (*)    Hgb urine dipstick SMALL (*)    All other components within normal limits  RESP PANEL BY RT-PCR (FLU A&B, COVID) ARPGX2  CBC  URINE DRUG SCREEN, QUALITATIVE ([facility] ONLY)  TROPONIN I (HIGH SENSITIVITY)  TROPONIN I (HIGH SENSITIVITY)   ____________________________________________  EKG  ED ECG REPORT I, Kalla Watson J, the attending physician, personally viewed and interpreted this ECG.   Date: 12/29/2020  EKG Time: 2249   Rate: 72  Rhythm: normal sinus rhythm  Axis: LAD  Intervals:left anterior fascicular block  ST&T Change: Nonspecific  ____________________________________________  RADIOLOGY I, Solenne Manwarren J, personally viewed and evaluated these images (plain radiographs) as part of my medical decision making, as well as reviewing the written report by the radiologist.  ED MD interpretation: No acute cardiopulmonary process; MRI brain and lumbar spine pending  Official radiology report(s): DG Chest 2 View  Result Date: 12/28/2020 CLINICAL DATA:  Weakness EXAM: CHEST - 2 VIEW COMPARISON:  05/22/2019 FINDINGS: Heart and mediastinal contours are within normal limits. No focal opacities or effusions. No acute bony abnormality. IMPRESSION: No active cardiopulmonary disease. Electronically Signed   By: 05/24/2019 M.D.   On: 12/28/2020 22:59    ____________________________________________   PROCEDURES  Procedure(s) performed (including Critical Care):  Procedures   ____________________________________________   INITIAL IMPRESSION /  ASSESSMENT AND PLAN / ED COURSE  As part of my medical decision making, I reviewed the following data within the electronic MEDICAL RECORD NUMBER Nursing notes reviewed and incorporated, Labs reviewed, Old chart reviewed, Radiograph reviewed, and Notes from prior ED visits     55 year old male presenting with frequent falls and gait imbalance.  Differential diagnosis includes but is not limited to TIA/CVA, spinal stenosis, cauda equina syndrome, electrolyte abnormalities, infectious etiologies, etc.  Laboratory results unremarkable.  Will obtain MRI brain and lumbar spine.  Disposition per MRI results.    ----------------------------------------- 6:48 AM on 12/29/2020 -----------------------------------------   MRIs pending.  Care will be transferred to the oncoming provider.  Disposition per MRI results.  ____________________________________________   FINAL CLINICAL  IMPRESSION(S) / ED DIAGNOSES  Final diagnoses:  Recurrent falls     ED Discharge Orders     None        Note:  This document was prepared using Dragon voice recognition software and may include unintentional dictation errors.    Irean Hong, MD 12/29/20 [phone]

## 2020-12-29 NOTE — Telephone Encounter (Signed)
Patient called to ask about scheduling with Dr Dallas Schimke per his visit to [facility] emergency room for problem of frequent, multiple falls per day, and per MRI done at [facility],  "multiple level lumbar spinal stenosis as well as neuroforaminal stenosis likely explaining patient's persistent weakness and falls.  Patient also has bilateral mastoid effusions" Per our clinic administrator :  "He has already been seeing neurology, and given those MRI results, I would say he needs to see if Primary Care can refer him to an ortho spine doc or neurosurgery. He has already had all treatment we could offer here"  - patient voiced understanding that he ultimately needs referral to ortho spine specialist or neurosurgeon - will call his primary care at [facility], and his neurologist at [facility] to request referral.

## 2020-12-29 NOTE — ED Notes (Signed)
Pt to MRI

## 2020-12-29 NOTE — ED Notes (Signed)
Pt ambulatory to the room with a rollator.

## 2021-01-04 ENCOUNTER — Ambulatory Visit (HOSPITAL_COMMUNITY): Payer: Medicaid Other | Attending: Neurology

## 2021-01-04 ENCOUNTER — Other Ambulatory Visit: Payer: Self-pay

## 2021-01-04 DIAGNOSIS — R262 Difficulty in walking, not elsewhere classified: Secondary | ICD-10-CM | POA: Insufficient documentation

## 2021-01-04 DIAGNOSIS — R2689 Other abnormalities of gait and mobility: Secondary | ICD-10-CM | POA: Insufficient documentation

## 2021-01-04 DIAGNOSIS — R2681 Unsteadiness on feet: Secondary | ICD-10-CM | POA: Diagnosis present

## 2021-01-04 DIAGNOSIS — R292 Abnormal reflex: Secondary | ICD-10-CM | POA: Insufficient documentation

## 2021-01-04 DIAGNOSIS — M4807 Spinal stenosis, lumbosacral region: Secondary | ICD-10-CM | POA: Diagnosis not present

## 2021-01-04 DIAGNOSIS — M48061 Spinal stenosis, lumbar region without neurogenic claudication: Secondary | ICD-10-CM | POA: Diagnosis not present

## 2021-01-04 NOTE — Therapy (Signed)
[facility] [address] [place], [place], [address] Phone: [phone]   Fax:  [phone]  Physical Therapy Evaluation  Patient Details  Name: Hector Davies MRN: [id number] Date of Birth: 1965-04-14 Referring Provider (PT): Glendale Chard, DO   Encounter Date: 01/04/2021   PT End of Session - 01/04/21 1028     Visit Number 1    Number of Visits 8    Date for PT Re-Evaluation 02/01/21    Authorization Type [place] Medicaid    Authorization - Visit Number 1    Authorization - Number of Visits 3    Progress Note Due on Visit 10    PT Start Time 0945    PT Stop Time 1030    PT Time Calculation (min) 45 min    Activity Tolerance Patient tolerated treatment well    Behavior During Therapy [facility] for tasks assessed/performed             Past Medical History:  Diagnosis Date   Anxiety    COPD (chronic obstructive pulmonary disease) (HCC)    Depression    Gastric ulcer    Hypercholesterolemia     Past Surgical History:  Procedure Laterality Date   CATARACT EXTRACTION W/PHACO Right 11/13/2018   Procedure: CATARACT EXTRACTION PHACO AND INTRAOCULAR LENS PLACEMENT (IOC) RIGHT VISION BLUE;  Surgeon: Elliot Cousin, MD;  Location: [facility] ORS;  Service: Ophthalmology;  Laterality: Right;  [name]   01:18 CDE 12.46 Fluid pack lot # [id number]   NO PAST SURGERIES      There were no vitals filed for this visit.    Subjective Assessment - 01/04/21 0959     Subjective Diagnosis of spinal stenosis and recurring falls. Not much back pain, referred LE symptoms. Too many falls to count    How long can you stand comfortably? 10 min    How long can you walk comfortably? 5-10 min    Diagnostic tests MRI L-spine    Currently in Pain? No/denies    Pain Score 0-No pain                [facility] PT Assessment - 01/04/21 0001       Assessment   Medical Diagnosis Spinal stenosis    Referring Provider (PT) Glendale Chard, DO      Balance Screen    Has the patient fallen in the past 6 months Yes    How many times? too many to count    Has the patient had a decrease in activity level because of a fear of falling?  Yes    Is the patient reluctant to leave their home because of a fear of falling?  Yes      Home Environment   Living Environment Private residence    Living Arrangements Children    Available Help at Discharge Family    Type of Home House    Home Access Stairs to enter    Entrance Stairs-Number of Steps 4    Entrance Stairs-Rails None    Home Layout One level    Home Equipment [place] - single point;Walker - 2 wheels      Prior Function   Level of Independence Independent    Vocation On disability      Functional Tests   Functional tests Single leg stance      Single Leg Stance   Comments unable      Posture/Postural Control   Posture/Postural Control Postural limitations  Postural Limitations Increased thoracic kyphosis      ROM / Strength   AROM / PROM / Strength AROM;Strength      AROM   AROM Assessment Site Lumbar    Lumbar Flexion 10% restricted    Lumbar Extension 50% restricted    Lumbar - Right Side Bend WNL    Lumbar - Left Side Bend WNL      Strength   Overall Strength Within functional limits for tasks performed    Overall Strength Comments no myotome weakness perceived in sitting resisted tests    Strength Assessment Site Lumbar    Lumbar Flexion 3+/5    Lumbar Extension 2+/5                        Objective measurements completed on examination: See above findings.       [facility] Adult PT Treatment/Exercise - 01/04/21 0001       Transfers   Transfers Sit to Stand;Stand Pivot Transfers    Sit to Stand 6: Modified independent (Device/Increase time)    Five time sit to stand comments  38 sec      Ambulation/Gait   Ambulation/Gait Yes    Ambulation/Gait Assistance 4: Min guard    Ambulation Distance (Feet) 100 Feet    Assistive device Straight cane    Gait  Pattern Ataxic    Ambulation Surface Level;Indoor    Gait Comments      Exercises   Exercises Lumbar      Lumbar Exercises: Stretches   Passive Hamstring Stretch 1 rep;30 seconds    Single Knee to Chest Stretch Right;Left;2 reps;30 seconds    Double Knee to Chest Stretch 1 rep;30 seconds                     PT Education - 01/04/21 1028     Education Details education regarding spinal stenosis and flexion-bias. Encouraged to use RW vs cane for support and to promote spinal flexion    Person(s) Educated Patient    Methods Explanation;Demonstration    Comprehension Verbalized understanding              PT Short Term Goals - 01/04/21 1032       PT SHORT TERM GOAL #1   Title Patient will be independent with HEP in order to improve functional outcomes.    Time 2    Period Weeks    Status New    Target Date 01/18/21      PT SHORT TERM GOAL #2   Title Demo improved ambulation tolerance and safety per x 200 ft with least restrictve AD    Baseline 100 ft with cane and CGA    Time 2    Period Weeks    Status New    Target Date 01/18/21               PT Long Term Goals - 01/04/21 1033       PT LONG TERM GOAL #1   Title Demo improved balance and LE strength per 12 sec 5xSTS    Baseline 38 sec    Time 4    Period Weeks    Status New    Target Date 02/01/21      PT LONG TERM GOAL #2   Title Demo improved balance and motor control per 10 sec SLS    Baseline unable    Time 4    Period Weeks  Status New    Target Date 02/01/21                    Plan - 01/04/21 1029     Clinical Impression Statement Pt is 55 yo man with recurring falls and diagnosis of spinal stenosis demonstrating balance and gait/balance/motor control deficits/deviations with high risk for falls. Pt would benefit from PT services to train/instruct in HEP development and improve balance/ambulation to reduce risk for falls/injury.    Personal Factors and  Comorbidities Age;Time since onset of injury/illness/exacerbation;Comorbidity 1    Comorbidities Tbco abuse    Examination-Activity Limitations Bend;Carry;Lift;Stand;Stairs;Squat;Locomotion Level;Transfers    Examination-Participation Restrictions Cleaning;Community Activity;Yard Work;Occupation;Meal Prep    Stability/Clinical Decision Making Stable/Uncomplicated    Clinical Decision Making Low    Rehab Potential Good    PT Frequency 2x / week    PT Duration 4 weeks    PT Treatment/Interventions ADLs/Self Care Home Management;Aquatic Therapy;Electrical Stimulation;DME Instruction;Traction;Moist Heat;Gait training;Functional mobility training;Therapeutic activities;Therapeutic exercise;Balance training;Patient/family education;Neuromuscular re-education;Manual techniques;Passive range of motion;Taping;Dry needling;Spinal Manipulations;Joint Manipulations    PT Home Exercise Plan SKTC, DKTC, hamstring stretch    Consulted and Agree with Plan of Care Patient             Patient will benefit from skilled therapeutic intervention in order to improve the following deficits and impairments:  Abnormal gait, Decreased activity tolerance, Decreased balance, Decreased knowledge of use of DME, Decreased range of motion, Decreased strength, Difficulty walking, Postural dysfunction, Impaired flexibility  Visit Diagnosis: Difficulty in walking, not elsewhere classified  Unsteadiness on feet  Other abnormalities of gait and mobility     Problem List Patient Active Problem List   Diagnosis Date Noted   ALLERGIC RHINITIS 03/29/2010    Dion Body, PT 01/04/2021, 10:35 AM  [place] [facility] [address] [place], [place], [address] Phone: [phone]   Fax:  [phone]  Name: BRANNAN CASSEDY MRN: [id number] Date of Birth: 08/22/1965

## 2021-01-04 NOTE — Patient Instructions (Signed)
Access Code: [email or web address] URL: [email or web address] Date: 01/04/2021 Prepared by: Shary Decamp  Exercises Supine Single Knee to Chest Stretch - 1 x daily - 7 x weekly - 3 sets - 30-60 sec hold Supine Double Knee to Chest - 1 x daily - 7 x weekly - 3 sets - 30-60 sec hold Hooklying Hamstring Stretch with Strap - 1 x daily - 7 x weekly - 3 sets - 30-60 sec hold

## 2021-01-05 ENCOUNTER — Telehealth: Payer: Self-pay | Admitting: Neurology

## 2021-01-05 DIAGNOSIS — M4807 Spinal stenosis, lumbosacral region: Secondary | ICD-10-CM

## 2021-01-05 DIAGNOSIS — R2681 Unsteadiness on feet: Secondary | ICD-10-CM

## 2021-01-05 DIAGNOSIS — M48061 Spinal stenosis, lumbar region without neurogenic claudication: Secondary | ICD-10-CM

## 2021-01-05 DIAGNOSIS — R292 Abnormal reflex: Secondary | ICD-10-CM

## 2021-01-05 NOTE — Telephone Encounter (Signed)
Pt states that he saw Dr Allena Katz on 12-26-20 and had his MRI done on 12-28-20 at [facility]  He states that he is still falling left and right everyday and made appt to see Dr Allena Katz on 02-03-21  he wants to speak to someone about what is going on with him

## 2021-01-05 NOTE — Telephone Encounter (Signed)
Please inform pt that his MRI lumbar spine shows narrowing in the cord which may be causing his symptoms. 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 Please fax my last note and MRI lumbar spine report.    To be sure we are not missing any other problem of his spine, I would like to check MRI thoracic and cervical spine wo contrast. Please order, if pt agreeable.

## 2021-01-06 NOTE — Telephone Encounter (Signed)
Returned patients call. Someone picked up call and hung up.

## 2021-01-06 NOTE — Telephone Encounter (Signed)
Called patient and spoke with his wife and was informed he was using the bathroom and that they would have him call me back.

## 2021-01-06 NOTE — Telephone Encounter (Signed)
Pt called in and left a message returning Mahina's call 

## 2021-01-06 NOTE — Telephone Encounter (Signed)
Called patient and informed him of MRI results and recommendations from Dr. Allena Katz. Patient verbalized understanding of what was explained to him and is agreeable to have MRI of Thoracic and Cervical Spine ordered and agreeable to have a referral sent to Neurosurgery. Patient thanked me for the call and had no further questions or concerns.

## 2021-01-09 ENCOUNTER — Encounter (HOSPITAL_COMMUNITY): Payer: Self-pay

## 2021-01-09 ENCOUNTER — Ambulatory Visit (HOSPITAL_COMMUNITY): Payer: Medicaid Other

## 2021-01-09 ENCOUNTER — Other Ambulatory Visit: Payer: Self-pay

## 2021-01-09 ENCOUNTER — Other Ambulatory Visit: Payer: Medicaid Other

## 2021-01-09 DIAGNOSIS — R2689 Other abnormalities of gait and mobility: Secondary | ICD-10-CM

## 2021-01-09 DIAGNOSIS — R262 Difficulty in walking, not elsewhere classified: Secondary | ICD-10-CM

## 2021-01-09 DIAGNOSIS — R2681 Unsteadiness on feet: Secondary | ICD-10-CM

## 2021-01-09 NOTE — Therapy (Signed)
[facility] [address] [place], [place], [address] Phone: [phone]   Fax:  [phone]  Physical Therapy Treatment  Patient Details  Name: Hector Davies MRN: [id number] Date of Birth: 04/29/1965 Referring Provider (PT): Glendale Chard, DO   Encounter Date: 01/09/2021   PT End of Session - 01/09/21 1148     Visit Number 2    Number of Visits 8    Date for PT Re-Evaluation 02/01/21    Authorization Type [place] Medicaid    Authorization - Visit Number 1    Authorization - Number of Visits 3    Progress Note Due on Visit 10    PT Start Time 1152    PT Stop Time 1238    PT Time Calculation (min) 46 min    Activity Tolerance Patient tolerated treatment well    Behavior During Therapy [facility] for tasks assessed/performed             Past Medical History:  Diagnosis Date   Anxiety    COPD (chronic obstructive pulmonary disease) (HCC)    Depression    Gastric ulcer    Hypercholesterolemia     Past Surgical History:  Procedure Laterality Date   CATARACT EXTRACTION W/PHACO Right 11/13/2018   Procedure: CATARACT EXTRACTION PHACO AND INTRAOCULAR LENS PLACEMENT (IOC) RIGHT VISION BLUE;  Surgeon: Elliot Cousin, MD;  Location: [facility] ORS;  Service: Ophthalmology;  Laterality: Right;  [name]   01:18 CDE 12.46 Fluid pack lot # [id number]   NO PAST SURGERIES      There were no vitals filed for this visit.   Subjective Assessment - 01/09/21 1148     Subjective Feels most of the time when he falls he falls backwards; left ankle still swollen and painful especially standing and walking since his fall.    How long can you stand comfortably? 10 min    How long can you walk comfortably? 5-10 min    Diagnostic tests MRI L-spine    Currently in Pain? Yes    Pain Score 6     Pain Location Ankle    Pain Orientation Left    Pain Descriptors / Indicators [facility] Adult PT Treatment/Exercise - 01/09/21 0001        Posture/Postural Control   Posture/Postural Control Postural limitations    Postural Limitations Increased thoracic kyphosis      Lumbar Exercises: Stretches   Passive Hamstring Stretch 3 reps;30 seconds    Passive Hamstring Stretch Limitations with strap/belt    Single Knee to Chest Stretch Right;Left;30 seconds;3 reps    Double Knee to Chest Stretch 30 seconds;3 reps    Other Lumbar Stretch Exercise prone lying 3 minutes      Lumbar Exercises: Supine   Ab Set 10 reps;5 seconds    Bridge 10 reps;3 seconds      Lumbar Exercises: Prone   Straight Leg Raise 10 reps;2 seconds                PT Education - 01/09/21 1148     Education Details Reviewed initial evaluations, goals and HEP. Advanced HEP.    Person(s) Educated Patient    Methods Explanation;Handout    Comprehension Verbalized understanding              PT Short Term Goals - 01/04/21 1032       PT SHORT TERM GOAL #1  Title Patient will be independent with HEP in order to improve functional outcomes.    Time 2    Period Weeks    Status New    Target Date 01/18/21      PT SHORT TERM GOAL #2   Title Demo improved ambulation tolerance and safety per x 200 ft with least restrictve AD    Baseline 100 ft with cane and CGA    Time 2    Period Weeks    Status New    Target Date 01/18/21               PT Long Term Goals - 01/04/21 1033       PT LONG TERM GOAL #1   Title Demo improved balance and LE strength per 12 sec 5xSTS    Baseline 38 sec    Time 4    Period Weeks    Status New    Target Date 02/01/21      PT LONG TERM GOAL #2   Title Demo improved balance and motor control per 10 sec SLS    Baseline unable    Time 4    Period Weeks    Status New    Target Date 02/01/21                   Plan - 01/09/21 1149     Clinical Impression Statement Reviewed initial evaluation, goals and initial HEP. Patient was able to performed initial HEP with minimal cuing. Adjusted SPC  down 5 slots for better mechanics and tolerated well by patient. Added hook lying abdominal isometric, supine bridge, prone lying and prone leg raise to therapeutic exercises and HEP. Handout issued. advised patient performing supine exercises on floor would be better than bed if able. Discussed with daughter at end of session. Patient reported achiness in low back at end of session but not low back pain complaints during exercises. Left ankle pain largest complaint today. Patient will benefit from skilled physical therapy in order to improve function and reduce impairment.    Personal Factors and Comorbidities Age;Time since onset of injury/illness/exacerbation;Comorbidity 1    Comorbidities Tbco abuse    Examination-Activity Limitations Bend;Carry;Lift;Stand;Stairs;Squat;Locomotion Level;Transfers    Examination-Participation Restrictions Cleaning;Community Activity;Yard Work;Occupation;Meal Prep    Stability/Clinical Decision Making Stable/Uncomplicated    Rehab Potential Good    PT Frequency 2x / week    PT Duration 4 weeks    PT Treatment/Interventions ADLs/Self Care Home Management;Aquatic Therapy;Electrical Stimulation;DME Instruction;Traction;Moist Heat;Gait training;Functional mobility training;Therapeutic activities;Therapeutic exercise;Balance training;Patient/family education;Neuromuscular re-education;Manual techniques;Passive range of motion;Taping;Dry needling;Spinal Manipulations;Joint Manipulations    PT Next Visit Plan core/lower extremity strengthening supine progressing to standing, balance, developing HEP    PT Home Exercise Plan SKTC, DKTC, hamstring stretch; 11/14 ab set, bridge, prone SLR, prone lying    Consulted and Agree with Plan of Care Patient             Patient will benefit from skilled therapeutic intervention in order to improve the following deficits and impairments:  Abnormal gait, Decreased activity tolerance, Decreased balance, Decreased knowledge of use of  DME, Decreased range of motion, Decreased strength, Difficulty walking, Postural dysfunction, Impaired flexibility  Visit Diagnosis: Difficulty in walking, not elsewhere classified  Unsteadiness on feet  Other abnormalities of gait and mobility     Problem List Patient Active Problem List   Diagnosis Date Noted   ALLERGIC RHINITIS 03/29/2010   Katina Dung. Hartnett-Rands, MS, PT [place] [facility]  [place] #[id number]  Epifanio Lesches, PT 01/09/2021, 12:44 PM  [facility] [address] [place], [place], [address] Phone: [phone]   Fax:  [phone]  Name: Hector Davies MRN: [id number] Date of Birth: [birth year]

## 2021-01-09 NOTE — Patient Instructions (Addendum)
Isometric Abdominal    Lying on back with knees bent, fingers on abdomen, tighten stomach. Hold _5__ seconds. Repeat _10-15__ times per set. Do _1_ sets per session. Do _1_ sessions per day.  http://orth.exer.us/1086   Copyright  VHI. All rights reserved.   Bridging    Bend knees. Squeeze pelvic floor and hold. Lift bottom off bed. Hold for _3-5_ seconds. Repeat _10-15_ times. Do 1_ times a day. Repeat with right leg straight.    Copyright  VHI. All rights reserved.   Abdominal Lying    Lie on abdomen, pillows as needed under hips, ankles, forehead and chest. Rest forehead on hands or on folded towel as needed and/or instructed by your therapist. Lorenz Coaster _3_ minutes.  Copyright  VHI. All rights reserved.   Straight Leg Raise (Prone)    Abdomen and head supported, keep left knee locked and raise leg at hip. Avoid arching low back. Repeat _10-15___ times per set. Do _1___ sets per session. Do _1__ sessions per day.  http://orth.exer.us/1112   Copyright  VHI. All rights reserved.

## 2021-01-12 ENCOUNTER — Ambulatory Visit (HOSPITAL_COMMUNITY): Payer: Medicaid Other

## 2021-01-12 ENCOUNTER — Telehealth: Payer: Self-pay

## 2021-01-12 NOTE — Telephone Encounter (Signed)
Called patient and left a message for a call back. Need to let patient know that we have filled out his Disability paperwork from [employer] Before we can send it in, patient needs to fill out his part and also pay a $25 fee.

## 2021-01-13 ENCOUNTER — Emergency Department (HOSPITAL_COMMUNITY)
Admission: EM | Admit: 2021-01-13 | Discharge: 2021-01-13 | Disposition: A | Discharge status: Home / Self Care | Payer: Medicaid Other | Attending: Emergency Medicine | Admitting: Emergency Medicine

## 2021-01-13 ENCOUNTER — Other Ambulatory Visit: Payer: Self-pay

## 2021-01-13 ENCOUNTER — Emergency Department (HOSPITAL_COMMUNITY): Payer: Medicaid Other

## 2021-01-13 ENCOUNTER — Encounter (HOSPITAL_COMMUNITY): Payer: Self-pay | Admitting: *Deleted

## 2021-01-13 DIAGNOSIS — J449 Chronic obstructive pulmonary disease, unspecified: Secondary | ICD-10-CM | POA: Diagnosis not present

## 2021-01-13 DIAGNOSIS — Z7951 Long term (current) use of inhaled steroids: Secondary | ICD-10-CM | POA: Diagnosis not present

## 2021-01-13 DIAGNOSIS — F17200 Nicotine dependence, unspecified, uncomplicated: Secondary | ICD-10-CM | POA: Insufficient documentation

## 2021-01-13 DIAGNOSIS — R600 Localized edema: Secondary | ICD-10-CM | POA: Insufficient documentation

## 2021-01-13 DIAGNOSIS — M25572 Pain in left ankle and joints of left foot: Secondary | ICD-10-CM | POA: Insufficient documentation

## 2021-01-13 LAB — COMPREHENSIVE METABOLIC PANEL
ALT: 37 U/L (ref 0–44)
AST: 27 U/L (ref 15–41)
Albumin: 4 g/dL (ref 3.5–5.0)
Alkaline Phosphatase: 88 U/L (ref 38–126)
Anion gap: 8 (ref 5–15)
BUN: 17 mg/dL (ref 6–20)
CO2: 28 mmol/L (ref 22–32)
Calcium: 9.2 mg/dL (ref 8.9–10.3)
Chloride: 101 mmol/L (ref 98–111)
Creatinine, Ser: 0.94 mg/dL (ref 0.61–1.24)
GFR, Estimated: >60 mL/min (ref >60)
Glucose, Bld: 97 mg/dL (ref 70–99)
Potassium: 4.3 mmol/L (ref 3.5–5.1)
Sodium: 137 mmol/L (ref 135–145)
Total Bilirubin: 0.4 mg/dL (ref 0.3–1.2)
Total Protein: 7.5 g/dL (ref 6.5–8.1)

## 2021-01-13 LAB — CBC WITH DIFFERENTIAL/PLATELET
Abs Immature Granulocytes: 0.02 10*3/uL (ref 0.00–0.07)
Basophils Absolute: 0 10*3/uL (ref 0.0–0.1)
Basophils Relative: 1 %
Eosinophils Absolute: 0.1 10*3/uL (ref 0.0–0.5)
Eosinophils Relative: 1 %
HCT: 43.8 % (ref 39.0–52.0)
Hemoglobin: 15.1 g/dL (ref 13.0–17.0)
Immature Granulocytes: 0 %
Lymphocytes Relative: 18 %
Lymphs Abs: 1.4 10*3/uL (ref 0.7–4.0)
MCH: 31.3 pg (ref 26.0–34.0)
MCHC: 34.5 g/dL (ref 30.0–36.0)
MCV: 90.9 fL (ref 80.0–100.0)
Monocytes Absolute: 0.7 10*3/uL (ref 0.1–1.0)
Monocytes Relative: 9 %
Neutro Abs: 5.6 10*3/uL (ref 1.7–7.7)
Neutrophils Relative %: 71 %
Platelets: 256 10*3/uL (ref 150–400)
RBC: 4.82 MIL/uL (ref 4.22–5.81)
RDW: 12.9 % (ref 11.5–15.5)
WBC: 7.8 10*3/uL (ref 4.0–10.5)
nRBC: 0 % (ref 0.0–0.2)

## 2021-01-13 LAB — BRAIN NATRIURETIC PEPTIDE: B Natriuretic Peptide: 12 pg/mL (ref 0.0–100.0)

## 2021-01-13 MED ORDER — CEPHALEXIN 500 MG PO CAPS: 500.0000 mg | ORAL_CAPSULE | Freq: Four times a day (QID) | ORAL | 0 refills | Status: DC

## 2021-01-13 NOTE — Discharge Instructions (Addendum)
You are seen here today for evaluation of your left ankle pain.  Your x-rays and vascular study were negative.  This is likely an ankle sprain/strain.  You do have significant swelling to your left lower leg and redness which may be from cellulitis.  For this, you will be started on antibiotic, Keflex, to take to prevent infection.  Please do the antibiotic as prescribed and complete the entirety of its course to prevent furthering infection.  Follow-up with PCP for reevaluation of your leg. If any concern, new or worsening symptoms, please return to the nearest emergency department for reevaluation.

## 2021-01-13 NOTE — ED Provider Notes (Addendum)
[facility] EMERGENCY DEPARTMENT Provider Note   CSN: [id number] Arrival date & time: 01/13/21  1027     History Chief Complaint  Patient presents with   Foot Injury    Hector Davies is a 55 y.o. male history of spinal stenosis presents emergency department for evaluation of his left ankle.  Patient ports has a history of multiple falls and usually ambulates with a walker at baseline.  Patient reports he was walking with a walker when he inverted his left ankle and stumbled.  Denies any weakness, numbness, tingling.  Patient reports it has been swollen since and painful to walk on.  He denies a history of blood clots.  Denies any history of long travel or plane rides.  Denies any exogenous hormone use.  Patient is a smoker.  He denies any chest pain or shortness of breath.  Medical history includes COPD.  He denies any orthopedic surgeries to that area.  No known drug allergies.  Patient is a daily smoker.   Foot Injury Associated symptoms: no back pain and no fever       Past Medical History:  Diagnosis Date   Anxiety    COPD (chronic obstructive pulmonary disease) ([place])    Depression    Gastric ulcer    Hypercholesterolemia     Patient Active Problem List   Diagnosis Date Noted   ALLERGIC RHINITIS 03/29/2010    Past Surgical History:  Procedure Laterality Date   CATARACT EXTRACTION W/PHACO Right 11/13/2018   Procedure: CATARACT EXTRACTION PHACO AND INTRAOCULAR LENS PLACEMENT ([place]) RIGHT VISION BLUE;  Surgeon: Marchia Meiers, MD;  Location: [facility] ORS;  Service: Ophthalmology;  Laterality: Right;  [name]   01:18 CDE 12.46 Fluid pack lot # [id number]   NO PAST SURGERIES         Family History  Problem Relation Age of Onset   Uterine cancer Mother    Stroke Father     Social History   Tobacco Use   Smoking status: Every Day   Smokeless tobacco: Never  Vaping Use   Vaping Use: Never used  Substance Use Topics   Alcohol use: Yes    Comment: every couple weeks    Drug use: No    Home Medications Prior to Admission medications   Medication Sig Start Date End Date Taking? Authorizing Provider  cephALEXin (KEFLEX) 500 MG capsule Take 1 capsule (500 mg total) by mouth 4 (four) times daily. 01/13/21  Yes Sherrell Puller, PA-C  albuterol (VENTOLIN HFA) 108 (90 Base) MCG/ACT inhaler Inhale 2 puffs into the lungs 4 (four) times daily as needed. 05/29/18   [provider]  Calcium Citrate-Vitamin D (CALCIUM + D PO) Take 1 tablet by mouth daily.  Patient not taking: Reported on 12/26/2020    [provider]  cetirizine (ZYRTEC) 10 MG tablet Take 10 mg by mouth daily. 12/14/20   [provider]  citalopram (CELEXA) 40 MG tablet Take 40 mg by mouth daily. 40 mg Daily    [provider]  Glycopyrrolate-Formoterol (BEVESPI AEROSPHERE IN) Inhale 1 puff into the lungs 2 (two) times daily.    [provider]  Multiple Vitamin (MULTIVITAMIN WITH MINERALS) TABS tablet Take 1 tablet by mouth daily.    [provider]  naproxen (NAPROSYN) 500 MG tablet Take 1 pill twice a day as needed for pain and numbness 06/04/20   Milton Ferguson, MD  SPIRIVA RESPIMAT 2.5 MCG/ACT AERS Inhale 2 sprays into the lungs daily. 05/29/18   [provider]    Allergies    Patient has no known allergies.  Review of Systems   Review of Systems  Constitutional:  Negative for chills and fever.  HENT:  Negative for congestion, ear pain and sore throat.   Eyes:  Negative for pain and visual disturbance.  Respiratory:  Negative for cough, chest tightness, shortness of breath and wheezing.   Cardiovascular:  Positive for leg swelling. Negative for chest pain and palpitations.  Gastrointestinal:  Negative for abdominal pain, constipation, nausea and vomiting.  Genitourinary:  Negative for dysuria and hematuria.  Musculoskeletal:  Negative for arthralgias and back pain.  Skin:  Negative for color change and rash.  Neurological:  Negative for  seizures, syncope, weakness and numbness.  All other systems reviewed and are negative.  Physical Exam Updated Vital Signs BP (!) 145/88 (BP Location: Right Arm)   Pulse 70   Temp 98 F (36.7 C) (Oral)   Resp 18   Ht 5\' 9"  (1.753 m)   Wt 97.1 kg   SpO2 97%   BMI 31.60 kg/m   Physical Exam Vitals and nursing note reviewed.  Constitutional:      General: He is not in acute distress.    Appearance: Normal appearance. He is not toxic-appearing.  Eyes:     General: No scleral icterus. Cardiovascular:     Rate and Rhythm: Normal rate.  Pulmonary:     Effort: Pulmonary effort is normal. No respiratory distress.  Musculoskeletal:        General: Swelling and tenderness present. No deformity.     Left lower leg: Edema present.     Comments: Significant swelling to the left foot/ankle extending to the midshaft of the lower leg with erythema.  Pitting edema.  Dopplerable DP and PT pulses.  Sensation intact.  Full range of motion with pain.  Skin:    General: Skin is dry.     Findings: No rash.  Neurological:     General: No focal deficit present.     Mental Status: He is alert. Mental status is at baseline.  Psychiatric:        Mood and Affect: Mood normal.    ED Results / Procedures / Treatments   Labs (all labs ordered are listed, but only abnormal results are displayed) Labs Reviewed  COMPREHENSIVE METABOLIC PANEL  CBC WITH DIFFERENTIAL/PLATELET  BRAIN NATRIURETIC PEPTIDE    EKG None  Radiology DG Ankle Complete Left  Result Date: 01/13/2021 CLINICAL DATA:  Pain and swelling EXAM: LEFT ANKLE COMPLETE - 3+ VIEW COMPARISON:  None. FINDINGS: There is no evidence of fracture, dislocation, or joint effusion. There is no evidence of arthropathy or other focal bone abnormality. Soft tissue swelling of the left ankle. IMPRESSION: No acute osseous abnormality. Electronically Signed   By: Yetta Glassman M.D.   On: 01/13/2021 12:32   US Venous Img Lower Unilateral  Left  Result Date: 01/13/2021 CLINICAL DATA:  Left lower extremity pain and edema. Patient twisted left foot 6 days ago. Evaluate for DVT. EXAM: LEFT LOWER EXTREMITY VENOUS DOPPLER ULTRASOUND TECHNIQUE: Gray-scale sonography with graded compression, as well as color Doppler and duplex ultrasound were performed to evaluate the lower extremity deep venous systems from the level of the common femoral vein and including the common femoral, femoral, profunda femoral, popliteal and calf veins including the posterior tibial, peroneal and gastrocnemius veins when visible. The superficial great saphenous vein was also interrogated. Spectral Doppler was utilized to evaluate flow at rest and with  distal augmentation maneuvers in the common femoral, femoral and popliteal veins. COMPARISON:  None. FINDINGS: Contralateral Common Femoral Vein: Respiratory phasicity is normal and symmetric with the symptomatic side. No evidence of thrombus. Normal compressibility. Common Femoral Vein: No evidence of thrombus. Normal compressibility, respiratory phasicity and response to augmentation. Saphenofemoral Junction: No evidence of thrombus. Normal compressibility and flow on color Doppler imaging. Profunda Femoral Vein: No evidence of thrombus. Normal compressibility and flow on color Doppler imaging. Femoral Vein: No evidence of thrombus. Normal compressibility, respiratory phasicity and response to augmentation. Popliteal Vein: No evidence of thrombus. Normal compressibility, respiratory phasicity and response to augmentation. Calf Veins: No evidence of thrombus. Normal compressibility and flow on color Doppler imaging. Superficial Great Saphenous Vein: No evidence of thrombus. Normal compressibility. Venous Reflux:  None. Other Findings:  None. IMPRESSION: No evidence of DVT within the left lower extremity. Electronically Signed   By: Sandi Mariscal M.D.   On: 01/13/2021 14:39   DG Foot Complete Left  Result Date:  01/13/2021 CLINICAL DATA:  Pain and swelling, fall EXAM: LEFT FOOT - COMPLETE 3+ VIEW COMPARISON:  None. FINDINGS: There is minimal deformity in the shaft of proximal phalanx of fifth toe with no radiolucent fracture line. There is no dislocation. IMPRESSION: There is deformity in the shaft of proximal phalanx of left fifth toe without break in the cortical margins which may suggest recent or old undisplaced fracture. Please correlate with clinical findings. Electronically Signed   By: Elmer Picker M.D.   On: 01/13/2021 12:36    Procedures Procedures   Medications Ordered in ED Medications - No data to display  ED Course  I have reviewed the triage vital signs and the nursing notes.  Pertinent labs & imaging results that were available during my care of the patient were reviewed by me and considered in my medical decision making (see chart for details).  55 year old male presents emergency department for evaluation of left ankle pain and swelling.  Differential diagnosis includes but not limited to fracture, dislocation, cellulitis, DVT, seizure exacerbation, arterial block.  Compartments soft, low suspicion for compartment syndrome.  Given the presentation of the leg, DVT study was ordered.  DVT study negative.  The patient's left leg is erythematous in comparison to the right leg.  I discussed this case my attending who assessed him at bedside and also agreed that we should start him on Keflex to rule out a possible cellulitis of the leg.  His x-ray of his foot and ankle were benign for any acute fracture, although they noted a deformity in the shaft the proximal phalanx of the left fifth toe without break which may suggest recent or old undisplaced fracture.  On exam, patient is nontender to touch over this area.  My attending overlooked the x-ray as well did not see a fracture.  Patient ambulatory with walker and is able to weight-bear on the leg without walker.  The patient was placed  on Keflex for cellulitis coverage.  I recommended the patient to follow-up with his primary care provider for resolution of symptoms.  I discussed lab and imaging findings with the patient.  Discussed the Keflex for possible cellulitis.  Recommended compression, rest, ice, and elevation.  Return precautions discussed.  Patient agrees with plan.  Patient is stable being discharged home in good condition.   I discussed this case with my attending physician who cosigned this note including patient's presenting symptoms, physical exam, and planned diagnostics and interventions. Attending physician stated agreement with plan  or made changes to plan which were implemented.   Attending physician assessed patient at bedside.   MDM Rules/Calculators/A&P                         Final Clinical Impression(s) / ED Diagnoses Final diagnoses:  Acute left ankle pain    Rx / DC Orders ED Discharge Orders          Ordered    cephALEXin (KEFLEX) 500 MG capsule  4 times daily        01/13/21 1515             Achille Rich, PA-C 01/14/21 0015    Achille Rich, PA-C 01/14/21 0016    Terald Sleeper, MD 01/14/21 [phone]

## 2021-01-13 NOTE — ED Triage Notes (Signed)
Pt states he twisted his left foot 6 days ago. Not any better and with swelling.  Pt used a walker to ambulate to triage.

## 2021-01-16 ENCOUNTER — Encounter (HOSPITAL_COMMUNITY): Payer: Medicaid Other | Admitting: Physical Therapy

## 2021-01-16 NOTE — Telephone Encounter (Signed)
Called patient and informed him that we have filled out his Disability paperwork from [employer] and before we can send it in, patient needs to fill out his part and also pay a $25 fee. Patient stated that he will come tomorrow 01/16/21 to fill out his part and pay $25 fee. Informed patient of our office hours. Patient verbalized understanding and had no further questions or concerns.   Paper work has been placed in folder at [employer].

## 2021-01-17 DIAGNOSIS — Z0279 Encounter for issue of other medical certificate: Secondary | ICD-10-CM

## 2021-01-23 ENCOUNTER — Other Ambulatory Visit: Payer: Medicaid Other

## 2021-01-23 ENCOUNTER — Ambulatory Visit (HOSPITAL_COMMUNITY): Payer: Medicaid Other

## 2021-01-24 ENCOUNTER — Telehealth: Payer: Self-pay | Admitting: Neurology

## 2021-01-24 NOTE — Telephone Encounter (Signed)
Pt called he is scheduled for MRIs on 02/03/21 pt appointment with Dr Allena Katz was canceled per Dr Allena Katz because we are waiting for the MRI results we will reschedule pt when we call them with the results,

## 2021-01-24 NOTE — Telephone Encounter (Signed)
Working with [facility] to get pt scheduled with them

## 2021-01-24 NOTE — Telephone Encounter (Signed)
Patient called and said he went to [facility] to get some imaging tests yesterday but they did have him on the schedule.  Please advise patient.

## 2021-01-26 ENCOUNTER — Ambulatory Visit (HOSPITAL_COMMUNITY): Payer: Medicaid Other | Attending: Neurology | Admitting: Physical Therapy

## 2021-01-26 ENCOUNTER — Other Ambulatory Visit: Payer: Self-pay

## 2021-01-26 DIAGNOSIS — R2689 Other abnormalities of gait and mobility: Secondary | ICD-10-CM | POA: Insufficient documentation

## 2021-01-26 DIAGNOSIS — R262 Difficulty in walking, not elsewhere classified: Secondary | ICD-10-CM | POA: Insufficient documentation

## 2021-01-26 DIAGNOSIS — R2681 Unsteadiness on feet: Secondary | ICD-10-CM | POA: Diagnosis not present

## 2021-01-26 NOTE — Therapy (Signed)
[facility] [address] [place], [place], [address] Phone: [phone]   Fax:  [phone]  Physical Therapy Treatment  Patient Details  Name: Hector Davies MRN: [id number] Date of Birth: Sep 11, 1965 Referring Provider (PT): Glendale Chard, DO   Encounter Date: 01/26/2021   PT End of Session - 01/26/21 0835     Visit Number 3    Number of Visits 10    Date for PT Re-Evaluation 02/25/21    Authorization Type [place] Medicaid resubmitted more visits    Progress Note Due on Visit 10    PT Start Time 0820    PT Stop Time 0840    PT Time Calculation (min) 20 min             Past Medical History:  Diagnosis Date   Anxiety    COPD (chronic obstructive pulmonary disease) ([facility])    Depression    Gastric ulcer    Hypercholesterolemia     Past Surgical History:  Procedure Laterality Date   CATARACT EXTRACTION W/PHACO Right 11/13/2018   Procedure: CATARACT EXTRACTION PHACO AND INTRAOCULAR LENS PLACEMENT ([facility]) RIGHT VISION BLUE;  Surgeon: Elliot Cousin, MD;  Location: [facility] ORS;  Service: Ophthalmology;  Laterality: Right;  [name]   01:18 CDE 12.46 Fluid pack lot # [id number]   NO PAST SURGERIES      There were no vitals filed for this visit.   Subjective Assessment - 01/26/21 0819     Subjective PT has only been to therapy once since his eval on 11/9 2022.  Pt states that he was having trouble getting transportation here he has now been hooked up with [facility]    How long can you stand comfortably? 10 min    How long can you walk comfortably? 5-10 min    Diagnostic tests MRI L-spine    Currently in Pain? Yes    Pain Score 4    highest 6/10; lowest 0   Pain Location Back    Pain Orientation Lower   left greater than right   Pain Descriptors / Indicators Aching    Pain Type Acute pain    Pain Onset More than a month ago    Pain Frequency Intermittent    Aggravating Factors  lifting; taking out trash, bringing in groceries    Pain  Relieving Factors lying down    Effect of Pain on Daily Activities limits                [facility] PT Assessment - 01/26/21 0001       Assessment   Medical Diagnosis Spinal stenosis    Referring Provider (PT) Glendale Chard, DO      Home Environment   Living Environment Private residence    Living Arrangements Children    Available Help at Discharge Family    Type of Home House    Home Access Stairs to enter    Entrance Stairs-Number of Steps 4    Entrance Stairs-Rails None    Home Layout One level    Home Equipment [place] - single point;Walker - 2 wheels      Prior Function   Level of Independence Independent    Vocation On disability      Functional Tests   Functional tests Single leg stance      Single Leg Stance   Comments unable      Posture/Postural Control   Posture/Postural Control Postural limitations    Postural Limitations Increased thoracic  kyphosis      AROM   Lumbar Flexion 10% restricted    Lumbar Extension 50% restricted    Lumbar - Right Side Bend WNL    Lumbar - Left Side Bend WNL      Strength   Overall Strength Within functional limits for tasks performed    Overall Strength Comments no myotome weakness perceived in sitting resisted tests    Strength Assessment Site Hip;Knee;Ankle    Right/Left Hip Right;Left    Right Hip Flexion 4/5    Right Hip Extension 4/5    Right Hip ABduction 5/5    Left Hip Flexion 4+/5    Left Hip Extension 5/5    Left Hip ABduction 5/5    Right/Left Knee Right;Left    Right Knee Flexion 4+/5    Right Knee Extension 3+/5    Left Knee Flexion 5/5    Left Knee Extension 4/5    Right/Left Ankle Right;Left    Right Ankle Dorsiflexion 5/5    Left Ankle Dorsiflexion 5/5    Lumbar Flexion 3+/5    Lumbar Extension 2+/5      Flexibility   Soft Tissue Assessment /Muscle Length yes    Hamstrings supine 90degree hip flexion :  Rt:  150; Lt:  148    Quadriceps prone Lt heel 10" from buttock ; Rt 7" from buttock       Transfers   Transfers Sit to Stand;Stand Pivot Transfers    Sit to Stand 6: Modified independent (Device/Increase time)    Five time sit to stand comments  38 sec                           [facility] Adult PT Treatment/Exercise - 01/26/21 0001       Ambulation/Gait   Ambulation/Gait --    Ambulation/Gait Assistance --    Ambulation Distance (Feet) --    Assistive device --    Gait Pattern --    Gait Comments --      Exercises   Exercises Lumbar      Lumbar Exercises: Stretches   Active Hamstring Stretch Limitations 1x30'    Prone on Elbows Stretch 1 rep;60 seconds   with deep breathing   Quad Stretch 2 reps;20 seconds;Left;Right      Lumbar Exercises: Supine   Ab Set 10 reps                       PT Short Term Goals - 01/26/21 0914       PT SHORT TERM GOAL #1   Title Patient will be independent with HEP in order to improve functional outcomes.    Time 2    Period Weeks    Status On-going    Target Date 01/18/21      PT SHORT TERM GOAL #2   Title Demo improved ambulation tolerance and safety per x 200 ft with least restrictve AD    Baseline 100 ft with cane and CGA    Time 2    Period Weeks    Status On-going    Target Date 01/18/21               PT Long Term Goals - 01/26/21 0914       PT LONG TERM GOAL #1   Title Demo improved balance and LE strength per 12 sec 5xSTS    Baseline 38 sec    Time 4  Period Weeks    Status On-going      PT LONG TERM GOAL #2   Title Demo improved balance and motor control per 10 sec SLS    Baseline unable    Time 4    Period Weeks    Status On-going                   Plan - 01/26/21 0910     Clinical Impression Statement Pt has only been seen once since his evaluation date on 01/04/2021.  Pt states transportation has been an issue and he is going to come via [facility] from now on.  Medicaid dates have expired.  Therapist mm tested his LE strength. noted POE pt has no back  extension.  Pt main limiting factors appear to be posture, mechanics,  ROM and balance as his strength is fairly good.  Therapist recommends to focus on these aspects.    Personal Factors and Comorbidities Age;Time since onset of injury/illness/exacerbation;Comorbidity 1    Comorbidities Tbco abuse    Examination-Activity Limitations Bend;Carry;Lift;Stand;Stairs;Squat;Locomotion Level;Transfers    Examination-Participation Restrictions Cleaning;Community Activity;Yard Work;Occupation;Meal Prep    Stability/Clinical Decision Making Stable/Uncomplicated    Clinical Decision Making Low    Rehab Potential Good    PT Frequency 2x / week    PT Duration 4 weeks    PT Treatment/Interventions ADLs/Self Care Home Management;Aquatic Therapy;Electrical Stimulation;DME Instruction;Traction;Moist Heat;Gait training;Functional mobility training;Therapeutic activities;Therapeutic exercise;Balance training;Patient/family education;Neuromuscular re-education;Manual techniques;Passive range of motion;Taping;Dry needling;Spinal Manipulations;Joint Manipulations    PT Next Visit Plan core/lower extremity strengthening supine progressing to standing, balance, developing HEP    PT Home Exercise Plan SKTC, DKTC, hamstring stretch; 11/14 ab set, bridge, prone SLR, prone lying; 12/1 ab set quad stretch             Patient will benefit from skilled therapeutic intervention in order to improve the following deficits and impairments:  Abnormal gait, Decreased activity tolerance, Decreased balance, Decreased knowledge of use of DME, Decreased range of motion, Decreased strength, Difficulty walking, Postural dysfunction, Impaired flexibility  Visit Diagnosis: Difficulty in walking, not elsewhere classified  Unsteadiness on feet  Other abnormalities of gait and mobility     Problem List Patient Active Problem List   Diagnosis Date Noted   ALLERGIC RHINITIS 03/29/2010   Virgina Organ, PT CLT [phone]   01/26/2021, 9:15 AM  [facility] [address] [place], [place], [address] Phone: [phone]   Fax:  [phone]  Name: SAED HUDLOW MRN: [id number] Date of Birth: [birth year]

## 2021-01-30 ENCOUNTER — Other Ambulatory Visit: Payer: Self-pay

## 2021-01-30 ENCOUNTER — Ambulatory Visit (HOSPITAL_COMMUNITY): Payer: Medicaid Other

## 2021-01-30 DIAGNOSIS — R2681 Unsteadiness on feet: Secondary | ICD-10-CM

## 2021-01-30 DIAGNOSIS — R2689 Other abnormalities of gait and mobility: Secondary | ICD-10-CM

## 2021-01-30 DIAGNOSIS — R262 Difficulty in walking, not elsewhere classified: Secondary | ICD-10-CM

## 2021-01-30 NOTE — Therapy (Signed)
[facility] [address] [place], [place], [address] Phone: [phone]   Fax:  [phone]  Physical Therapy Treatment  Patient Details  Name: Hector Davies MRN: [id number] Date of Birth: 08-21-1965 Referring Provider (PT): Glendale Chard, DO   Encounter Date: 01/30/2021   PT End of Session - 01/30/21 0949     Visit Number 4    Number of Visits 10    Date for PT Re-Evaluation 02/25/21    Authorization Type [place] Medicaid, approved 7 visits 01/27/21-02/23/21    Authorization Time Period 01/27/21--02/23/21    Authorization - Visit Number 1    Authorization - Number of Visits 7    Progress Note Due on Visit 10    PT Start Time 0945    PT Stop Time 1030    PT Time Calculation (min) 45 min             Past Medical History:  Diagnosis Date   Anxiety    COPD (chronic obstructive pulmonary disease) (HCC)    Depression    Gastric ulcer    Hypercholesterolemia     Past Surgical History:  Procedure Laterality Date   CATARACT EXTRACTION W/PHACO Right 11/13/2018   Procedure: CATARACT EXTRACTION PHACO AND INTRAOCULAR LENS PLACEMENT (IOC) RIGHT VISION BLUE;  Surgeon: Elliot Cousin, MD;  Location: [facility] ORS;  Service: Ophthalmology;  Laterality: Right;  [name]   01:18 CDE 12.46 Fluid pack lot # [id number]   NO PAST SURGERIES      There were no vitals filed for this visit.   Subjective Assessment - 01/30/21 0948     Subjective Notes he twisted his left ankle again. "It happens when I'm walking backwards" Notes his LE coordination and balance continues to be compromised    How long can you stand comfortably? 10 min    How long can you walk comfortably? 5-10 min    Diagnostic tests MRI L-spine    Currently in Pain? Yes    Pain Score 6     Pain Location Ankle    Pain Orientation Left    Pain Descriptors / Indicators Sore;Sharp    Pain Type Acute pain    Pain Onset More than a month ago                [facility] PT Assessment -  01/30/21 0001       Assessment   Medical Diagnosis Spinal stenosis    Referring Provider (PT) Glendale Chard, DO                           [facility] Adult PT Treatment/Exercise - 01/30/21 0001       Lumbar Exercises: Aerobic   Nustep level 3 x 5 min      Lumbar Exercises: Standing   Other Standing Lumbar Exercises stair taps 2" height 5 lbs x 2 min. Static standing on airex 3x10 sec eyes closed over 60 sec, 2 rounds. Tandem stance 1x30 sec leftr\/right. Retrowalking x2 min. Single leg support rolling foam roll with opposite foot 1x10 left/right    Other Standing Lumbar Exercises sidestepping with 5# x 2 min. Hip extension 3x10      Lumbar Exercises: Seated   Long Arc Quad on Chair Strengthening;Both;2 sets;10 reps    LAQ on Chair Weights (lbs) 5                       PT  Short Term Goals - 01/26/21 0914       PT SHORT TERM GOAL #1   Title Patient will be independent with HEP in order to improve functional outcomes.    Time 2    Period Weeks    Status On-going    Target Date 01/18/21      PT SHORT TERM GOAL #2   Title Demo improved ambulation tolerance and safety per x 200 ft with least restrictve AD    Baseline 100 ft with cane and CGA    Time 2    Period Weeks    Status On-going    Target Date 01/18/21               PT Long Term Goals - 01/26/21 0914       PT LONG TERM GOAL #1   Title Demo improved balance and LE strength per 12 sec 5xSTS    Baseline 38 sec    Time 4    Period Weeks    Status On-going      PT LONG TERM GOAL #2   Title Demo improved balance and motor control per 10 sec SLS    Baseline unable    Time 4    Period Weeks    Status On-going                   Plan - 01/30/21 1024     Clinical Impression Statement Tx focus today on more motor control and activities to promote single limb balance/support to facilitate ankle/righting strategies to reduce risk for falls.  Observation reveals  difficulty with spine and hip extension due to flexed and kyphotic posture and LE weakness. Left ankle pain limits some activity tolerance and standing ability. COntinued sessions to improve dynamic balance and strength to reduce risk for falls    Personal Factors and Comorbidities Age;Time since onset of injury/illness/exacerbation;Comorbidity 1    Comorbidities Tbco abuse    Examination-Activity Limitations Bend;Carry;Lift;Stand;Stairs;Squat;Locomotion Level;Transfers    Examination-Participation Restrictions Cleaning;Community Activity;Yard Work;Occupation;Meal Prep    Stability/Clinical Decision Making Stable/Uncomplicated    Rehab Potential Good    PT Frequency 2x / week    PT Duration 4 weeks    PT Treatment/Interventions ADLs/Self Care Home Management;Aquatic Therapy;Electrical Stimulation;DME Instruction;Traction;Moist Heat;Gait training;Functional mobility training;Therapeutic activities;Therapeutic exercise;Balance training;Patient/family education;Neuromuscular re-education;Manual techniques;Passive range of motion;Taping;Dry needling;Spinal Manipulations;Joint Manipulations    PT Next Visit Plan core/lower extremity strengthening supine progressing to standing, balance, developing HEP    PT Home Exercise Plan SKTC, DKTC, hamstring stretch; 11/14 ab set, bridge, prone SLR, prone lying; 12/1 ab set quad stretch             Patient will benefit from skilled therapeutic intervention in order to improve the following deficits and impairments:  Abnormal gait, Decreased activity tolerance, Decreased balance, Decreased knowledge of use of DME, Decreased range of motion, Decreased strength, Difficulty walking, Postural dysfunction, Impaired flexibility  Visit Diagnosis: Difficulty in walking, not elsewhere classified  Unsteadiness on feet  Other abnormalities of gait and mobility     Problem List Patient Active Problem List   Diagnosis Date Noted   ALLERGIC RHINITIS 03/29/2010     Dion Body, PT 01/30/2021, 10:32 AM  [facility] [address] [place], [place], [address] Phone: [phone]   Fax:  [phone]  Name: Hector Davies MRN: [id number] Date of Birth: 06-12-1965

## 2021-02-02 ENCOUNTER — Encounter (HOSPITAL_COMMUNITY): Payer: Self-pay

## 2021-02-02 ENCOUNTER — Ambulatory Visit (HOSPITAL_COMMUNITY): Payer: Medicaid Other

## 2021-02-02 ENCOUNTER — Other Ambulatory Visit: Payer: Self-pay

## 2021-02-02 DIAGNOSIS — R262 Difficulty in walking, not elsewhere classified: Secondary | ICD-10-CM

## 2021-02-02 DIAGNOSIS — R2689 Other abnormalities of gait and mobility: Secondary | ICD-10-CM

## 2021-02-02 DIAGNOSIS — R2681 Unsteadiness on feet: Secondary | ICD-10-CM

## 2021-02-02 NOTE — Therapy (Signed)
[facility] [address] [place], [place], [address] Phone: [phone]   Fax:  [phone]  Physical Therapy Treatment  Patient Details  Name: Hector Davies MRN: [id number] Date of Birth: [birth year] Referring Provider (PT): Glendale Chard, DO   Encounter Date: 02/02/2021   PT End of Session - 02/02/21 0855     Visit Number 5    Number of Visits 10    Date for PT Re-Evaluation 02/25/21    Authorization Type  Medicaid, approved 7 visits 01/27/21-02/23/21    Authorization Time Period 01/27/21--02/23/21    Authorization - Visit Number 2    Authorization - Number of Visits 7    Progress Note Due on Visit 10    PT Start Time 0856    PT Stop Time 0936    PT Time Calculation (min) 40 min             Past Medical History:  Diagnosis Date   Anxiety    COPD (chronic obstructive pulmonary disease) (HCC)    Depression    Gastric ulcer    Hypercholesterolemia     Past Surgical History:  Procedure Laterality Date   CATARACT EXTRACTION W/PHACO Right 11/13/2018   Procedure: CATARACT EXTRACTION PHACO AND INTRAOCULAR LENS PLACEMENT (IOC) RIGHT VISION BLUE;  Surgeon: Elliot Cousin, MD;  Location: [facility] ORS;  Service: Ophthalmology;  Laterality: Right;  [name]   01:18 CDE 12.46 Fluid pack lot # [id number]   NO PAST SURGERIES      There were no vitals filed for this visit.   Subjective Assessment - 02/02/21 0854     Subjective Patient reports he has been using ice and an ACE wrap but left ankle still swollen. pain in low back picking up stuff out the refridge and such.Getting 2 MRIs done tomorrow. Reports he has not received his disability check for the last 2 months.    How long can you stand comfortably? 10 min    How long can you walk comfortably? 5-10 min    Diagnostic tests MRI L-spine    Currently in Pain? Yes    Pain Score 3     Pain Location Ankle    Pain Orientation Left    Pain Onset More than a month ago                                [facility] Adult PT Treatment/Exercise - 02/02/21 0001       Lumbar Exercises: Aerobic   Nustep level 3 x 5 min      Lumbar Exercises: Standing   Other Standing Lumbar Exercises stair taps 2" height 5 lbs x 2 min. Static standing on airex 3x10 sec eyes closed over 60 sec, 2 rounds. Tandem stance 1x30 sec leftr\/right. Retrowalking x2 min. Single leg support rolling foam roll with opposite foot 1x10 left/right    Other Standing Lumbar Exercises sidestepping with 5# x 2 min. Hip extension 3x10 5#      Lumbar Exercises: Seated   Long Arc Quad on Chair Strengthening;Both;2 sets;10 reps    LAQ on Chair Weights (lbs) 5                       PT Short Term Goals - 02/02/21 8413       PT SHORT TERM GOAL #1   Title Patient will be independent with HEP in order to improve functional outcomes.  Time 2    Period Weeks    Status On-going    Target Date 01/18/21      PT SHORT TERM GOAL #2   Title Demo improved ambulation tolerance and safety per x 200 ft with least restrictve AD    Baseline 100 ft with cane and CGA    Time 2    Period Weeks    Status On-going    Target Date 01/18/21               PT Long Term Goals - 02/02/21 0935       PT LONG TERM GOAL #1   Title Demo improved balance and LE strength per 12 sec 5xSTS    Baseline 38 sec    Time 4    Period Weeks    Status On-going      PT LONG TERM GOAL #2   Title Demo improved balance and motor control per 10 sec SLS    Baseline unable    Time 4    Period Weeks    Status On-going                   Plan - 02/02/21 0855     Clinical Impression Statement Session focused on balance and strengthening of lower extremities activities to reduce risk for falls.  Reduced lumbar extension especially noted with hip extension, particularly with left leg. Left ankle limits standing exercise tolerance at this time. Continued sessions to improve dynamic balance and  strength to reduce risk for falls.    Personal Factors and Comorbidities Age;Time since onset of injury/illness/exacerbation;Comorbidity 1    Comorbidities Tbco abuse    Examination-Activity Limitations Bend;Carry;Lift;Stand;Stairs;Squat;Locomotion Level;Transfers    Examination-Participation Restrictions Cleaning;Community Activity;Yard Work;Occupation;Meal Prep    Stability/Clinical Decision Making Stable/Uncomplicated    Rehab Potential Good    PT Frequency 2x / week    PT Duration 4 weeks    PT Treatment/Interventions ADLs/Self Care Home Management;Aquatic Therapy;Electrical Stimulation;DME Instruction;Traction;Moist Heat;Gait training;Functional mobility training;Therapeutic activities;Therapeutic exercise;Balance training;Patient/family education;Neuromuscular re-education;Manual techniques;Passive range of motion;Taping;Dry needling;Spinal Manipulations;Joint Manipulations    PT Next Visit Plan core/lower extremity strengthening supine progressing to standing, balance, developing HEP    PT Home Exercise Plan SKTC, DKTC, hamstring stretch; 11/14 ab set, bridge, prone SLR, prone lying; 12/1 ab set quad stretch             Patient will benefit from skilled therapeutic intervention in order to improve the following deficits and impairments:  Abnormal gait, Decreased activity tolerance, Decreased balance, Decreased knowledge of use of DME, Decreased range of motion, Decreased strength, Difficulty walking, Postural dysfunction, Impaired flexibility  Visit Diagnosis: Difficulty in walking, not elsewhere classified  Unsteadiness on feet  Other abnormalities of gait and mobility     Problem List Patient Active Problem List   Diagnosis Date Noted   ALLERGIC RHINITIS 03/29/2010   Katina Dung. Hartnett-Rands, MS, PT [facility] [place] [phone]  Epifanio Lesches, PT 02/02/2021, 9:36 AM  [facility] [address]  [place], [place], [address] Phone: [phone]   Fax:  [phone]  Name: JAMIER URBAS MRN: [id number] Date of Birth: Nov 24, 1965

## 2021-02-03 ENCOUNTER — Ambulatory Visit: Payer: Medicaid Other | Admitting: Neurology

## 2021-02-03 ENCOUNTER — Ambulatory Visit
Admission: RE | Admit: 2021-02-03 | Discharge: 2021-02-03 | Disposition: A | Discharge status: Home / Self Care | Payer: Medicaid Other | Source: Ambulatory Visit | Attending: Neurology | Admitting: Neurology

## 2021-02-03 DIAGNOSIS — R292 Abnormal reflex: Secondary | ICD-10-CM

## 2021-02-03 DIAGNOSIS — M4807 Spinal stenosis, lumbosacral region: Secondary | ICD-10-CM

## 2021-02-03 DIAGNOSIS — R2681 Unsteadiness on feet: Secondary | ICD-10-CM

## 2021-02-06 ENCOUNTER — Other Ambulatory Visit: Payer: Self-pay

## 2021-02-06 ENCOUNTER — Ambulatory Visit (HOSPITAL_COMMUNITY): Payer: Medicaid Other | Admitting: Physical Therapy

## 2021-02-06 DIAGNOSIS — R2689 Other abnormalities of gait and mobility: Secondary | ICD-10-CM

## 2021-02-06 DIAGNOSIS — R262 Difficulty in walking, not elsewhere classified: Secondary | ICD-10-CM | POA: Diagnosis not present

## 2021-02-06 DIAGNOSIS — R2681 Unsteadiness on feet: Secondary | ICD-10-CM

## 2021-02-06 NOTE — Therapy (Signed)
[facility] [address] [place], [place], [address] Phone: [phone]   Fax:  [phone]  Physical Therapy Treatment  Patient Details  Name: Hector Davies MRN: [id number] Date of Birth: 1965-05-19 Referring Provider (PT): Glendale Chard, DO   Encounter Date: 02/06/2021   PT End of Session - 02/06/21 1033     Visit Number 6    Number of Visits 10    Date for PT Re-Evaluation 02/25/21    Authorization Type [place] Medicaid, approved 7 visits 01/27/21-02/23/21    Authorization Time Period 01/27/21--02/23/21    Authorization - Visit Number 3    Authorization - Number of Visits 7    Progress Note Due on Visit 10    PT Start Time 0922    PT Stop Time 1000    PT Time Calculation (min) 38 min             Past Medical History:  Diagnosis Date   Anxiety    COPD (chronic obstructive pulmonary disease) (HCC)    Depression    Gastric ulcer    Hypercholesterolemia     Past Surgical History:  Procedure Laterality Date   CATARACT EXTRACTION W/PHACO Right 11/13/2018   Procedure: CATARACT EXTRACTION PHACO AND INTRAOCULAR LENS PLACEMENT (IOC) RIGHT VISION BLUE;  Surgeon: Elliot Cousin, MD;  Location: [facility] ORS;  Service: Ophthalmology;  Laterality: Right;  [name]   01:18 CDE 12.46 Fluid pack lot # [id number]   NO PAST SURGERIES      There were no vitals filed for this visit.   Subjective Assessment - 02/06/21 0929     Subjective Pt states he fell last night.  STates he thinks he resprained his ankle. Currently 5/10 in Lt hip and 7/10 in Lt ankle.    Currently in Pain? Yes    Pain Score 7     Pain Location Ankle    Pain Orientation Left    Pain Descriptors / Indicators Aching;Sore                               [facility] Adult PT Treatment/Exercise - 02/06/21 0001       Lumbar Exercises: Standing   Scapular Retraction Strengthening;15 reps    Theraband Level (Scapular Retraction) Level 3 (Green)    Row  [employer];Theraband    Theraband Level (Row) Level 3 (Green)    Shoulder Extension Strengthening;15 reps;Theraband    Theraband Level (Shoulder Extension) Level 3 (Green)    Other Standing Lumbar Exercises stair taps 6" height 20X with 1 UE, 20X without UE assist. Static standing on airex 3x10 sec eyes closed over 60 sec, 2 rounds. Tandem stance 3x30 sec leftr/right. SLS max Rt:8", Lt: 5" without UE assist    Other Standing Lumbar Exercises retrowalking 2RT, sidestepping with 5# x 2 min. Hip extension 3x10 5#      Lumbar Exercises: Seated   Sit to Stand 10 reps    Sit to Stand Limitations from standard chair no UE's                       PT Short Term Goals - 02/02/21 4235       PT SHORT TERM GOAL #1   Title Patient will be independent with HEP in order to improve functional outcomes.    Time 2    Period Weeks    Status On-going    Target Date  01/18/21      PT SHORT TERM GOAL #2   Title Demo improved ambulation tolerance and safety per x 200 ft with least restrictve AD    Baseline 100 ft with cane and CGA    Time 2    Period Weeks    Status On-going    Target Date 01/18/21               PT Long Term Goals - 02/02/21 0935       PT LONG TERM GOAL #1   Title Demo improved balance and LE strength per 12 sec 5xSTS    Baseline 38 sec    Time 4    Period Weeks    Status On-going      PT LONG TERM GOAL #2   Title Demo improved balance and motor control per 10 sec SLS    Baseline unable    Time 4    Period Weeks    Status On-going                   Plan - 02/06/21 1033     Clinical Impression Statement Pt with reports of falls and increased pain in Lt hip and ankle.  Noted edema in ankle.  States he has compression stockings but can't get them on.  States his daughter will do it but didn't have time today.  Educated to put stockings on first thing in AM and remove in PM.  Pt verbalized understanding.  Able to increase  alternating toe tapping activity to 6" step and complete without UE but with intermittent HHA due to instability. Added sit to stand without UE form standard height chair with cues to control descent and basic stability.  Standing theraband exercises added to work on stability as well.  Pt with LOB into extension when began but able to to self correct following.  Encouraged to work on single leg stance at sink at home to help strengthen ankle.    Personal Factors and Comorbidities Age;Time since onset of injury/illness/exacerbation;Comorbidity 1    Comorbidities Tbco abuse    Examination-Activity Limitations Bend;Carry;Lift;Stand;Stairs;Squat;Locomotion Level;Transfers    Examination-Participation Restrictions Cleaning;Community Activity;Yard Work;Occupation;Meal Prep    Stability/Clinical Decision Making Stable/Uncomplicated    Rehab Potential Good    PT Frequency 2x / week    PT Duration 4 weeks    PT Treatment/Interventions ADLs/Self Care Home Management;Aquatic Therapy;Electrical Stimulation;DME Instruction;Traction;Moist Heat;Gait training;Functional mobility training;Therapeutic activities;Therapeutic exercise;Balance training;Patient/family education;Neuromuscular re-education;Manual techniques;Passive range of motion;Taping;Dry needling;Spinal Manipulations;Joint Manipulations    PT Next Visit Plan core/lower extremity strengthening supine progressing to standing, balance, developing HEP    PT Home Exercise Plan SKTC, DKTC, hamstring stretch; 11/14 ab set, bridge, prone SLR, prone lying; 12/1 ab set quad stretch             Patient will benefit from skilled therapeutic intervention in order to improve the following deficits and impairments:  Abnormal gait, Decreased activity tolerance, Decreased balance, Decreased knowledge of use of DME, Decreased range of motion, Decreased strength, Difficulty walking, Postural dysfunction, Impaired flexibility  Visit Diagnosis: Difficulty in walking,  not elsewhere classified  Unsteadiness on feet  Other abnormalities of gait and mobility     Problem List Patient Active Problem List   Diagnosis Date Noted   ALLERGIC RHINITIS 03/29/2010   Lurena Nida, PTA/CLT, WTA [phone]  Lurena Nida, PTA 02/06/2021, 10:34 AM  [facility] [address] [place], [place], [address] Phone: [phone]  Fax:  [phone]  Name: Hector Davies MRN: [id number] Date of Birth: [birth year]

## 2021-02-07 ENCOUNTER — Telehealth: Payer: Self-pay | Admitting: Neurology

## 2021-02-07 NOTE — Telephone Encounter (Signed)
Patient called to see if recent MRI results are ready yet?

## 2021-02-07 NOTE — Telephone Encounter (Signed)
Results of MRI cervical spine and thoracic spine discussed with patient, mild degenerative changes, nothing to explain his leg weakness and falls.  We sent referral to neurosurgery last month, however, he tells me that no one has called with an appointment.  I will have our staff look into this.  I also discussed that we can do EMG, however with his prominent upper motor neuron findings, neuropathy is very unlikely, so we will wait to see what neurosurgery recommends.  He also had a fall and sprained his ankle a few days ago, he says it is bruised. I asked him to rest, elevate, and apply ice compression, if no improvement follow-up with PCP.

## 2021-02-08 NOTE — Telephone Encounter (Signed)
Called [facility] to f/u on patients refferal. Was informed that they have contacted patient and are waiting on a call back from him. Informed [facility] I will contact patient and let him know.

## 2021-02-08 NOTE — Telephone Encounter (Signed)
Called patient and left a message per DPR and provided him with [facility] phone number so that he can call and get his appointment scheduled. Informed patient that they are waiting on his call back and have received his referral.

## 2021-02-09 ENCOUNTER — Other Ambulatory Visit: Payer: Self-pay

## 2021-02-09 ENCOUNTER — Ambulatory Visit (HOSPITAL_COMMUNITY): Payer: Medicaid Other | Admitting: Physical Therapy

## 2021-02-09 DIAGNOSIS — R262 Difficulty in walking, not elsewhere classified: Secondary | ICD-10-CM | POA: Diagnosis not present

## 2021-02-09 DIAGNOSIS — R2689 Other abnormalities of gait and mobility: Secondary | ICD-10-CM

## 2021-02-09 DIAGNOSIS — R2681 Unsteadiness on feet: Secondary | ICD-10-CM

## 2021-02-09 NOTE — Therapy (Signed)
[facility] [address] [place], [place], [address] Phone: [phone]   Fax:  [phone]  Physical Therapy Treatment  Patient Details  Name: Hector Davies MRN: [id number] Date of Birth: September 03, 1965 Referring Provider (PT): Glendale Chard, DO   Encounter Date: 02/09/2021   PT End of Session - 02/09/21 1044     Visit Number 7    Number of Visits 10    Date for PT Re-Evaluation 02/25/21    Authorization Type [place] Medicaid, approved 7 visits 01/27/21-02/23/21    Authorization Time Period 01/27/21--02/23/21    Authorization - Visit Number 4    Authorization - Number of Visits 7    Progress Note Due on Visit 10    PT Start Time 1002    PT Stop Time 1044    PT Time Calculation (min) 42 min             Past Medical History:  Diagnosis Date   Anxiety    COPD (chronic obstructive pulmonary disease) (HCC)    Depression    Gastric ulcer    Hypercholesterolemia     Past Surgical History:  Procedure Laterality Date   CATARACT EXTRACTION W/PHACO Right 11/13/2018   Procedure: CATARACT EXTRACTION PHACO AND INTRAOCULAR LENS PLACEMENT (IOC) RIGHT VISION BLUE;  Surgeon: Elliot Cousin, MD;  Location: [facility] ORS;  Service: Ophthalmology;  Laterality: Right;  [name]   01:18 CDE 12.46 Fluid pack lot # [id number]   NO PAST SURGERIES      There were no vitals filed for this visit.   Subjective Assessment - 02/09/21 1015     Subjective pt states he's about the same today.  States he has to see a neurosurgeon and waiting for an appoitment to be sceduled.   Lt Ankle is hurting today at 5/10.    Currently in Pain? Yes    Pain Score 5     Pain Location Ankle    Pain Orientation Left                               [facility] Adult PT Treatment/Exercise - 02/09/21 0001       Lumbar Exercises: Standing   Scapular Retraction Strengthening;15 reps    Theraband Level (Scapular Retraction) Level 3 (Green)    Row [employer];Theraband    Theraband Level (Row) Level 3 (Green)    Shoulder Extension Strengthening;15 reps;Theraband    Theraband Level (Shoulder Extension) Level 3 (Green)    Other Standing Lumbar Exercises stair taps 6" height 20X with 1 UE, 20X without UE assist. Tandem stance 3x30 sec leftr/right. SLS max Rt:7", Lt: 8" without UE assist    Other Standing Lumbar Exercises retrowalking 2RT, sidestepping with 5# x 2 min. Hip extension 3x10 5#      Lumbar Exercises: Seated   Sit to Stand 10 reps    Sit to Stand Limitations from standard chair no UE's                       PT Short Term Goals - 02/02/21 5329       PT SHORT TERM GOAL #1   Title Patient will be independent with HEP in order to improve functional outcomes.    Time 2    Period Weeks    Status On-going    Target Date 01/18/21      PT SHORT TERM GOAL #2  Title Demo improved ambulation tolerance and safety per x 200 ft with least restrictve AD    Baseline 100 ft with cane and CGA    Time 2    Period Weeks    Status On-going    Target Date 01/18/21               PT Long Term Goals - 02/02/21 0935       PT LONG TERM GOAL #1   Title Demo improved balance and LE strength per 12 sec 5xSTS    Baseline 38 sec    Time 4    Period Weeks    Status On-going      PT LONG TERM GOAL #2   Title Demo improved balance and motor control per 10 sec SLS    Baseline unable    Time 4    Period Weeks    Status On-going                   Plan - 02/09/21 1046     Clinical Impression Statement Continued with focus on LE strength and stability.    Impulsivities especially when backing to chair or turning result in LOB that requires assist to correct.  Instructions to complete all activities slowly with control to prevent LOB.   Poor eccentric control when descending to sit.  Cues to use increased planning of movements to help improve stability.  Reports he continues to fall at home.    Personal Factors  and Comorbidities Age;Time since onset of injury/illness/exacerbation;Comorbidity 1    Comorbidities Tbco abuse    Examination-Activity Limitations Bend;Carry;Lift;Stand;Stairs;Squat;Locomotion Level;Transfers    Examination-Participation Restrictions Cleaning;Community Activity;Yard Work;Occupation;Meal Prep    Stability/Clinical Decision Making Stable/Uncomplicated    Rehab Potential Good    PT Frequency 2x / week    PT Duration 4 weeks    PT Treatment/Interventions ADLs/Self Care Home Management;Aquatic Therapy;Electrical Stimulation;DME Instruction;Traction;Moist Heat;Gait training;Functional mobility training;Therapeutic activities;Therapeutic exercise;Balance training;Patient/family education;Neuromuscular re-education;Manual techniques;Passive range of motion;Taping;Dry needling;Spinal Manipulations;Joint Manipulations    PT Next Visit Plan core/lower extremity strengthening supine progressing to standing, balance, developing HEP    PT Home Exercise Plan SKTC, DKTC, hamstring stretch; 11/14 ab set, bridge, prone SLR, prone lying; 12/1 ab set quad stretch             Patient will benefit from skilled therapeutic intervention in order to improve the following deficits and impairments:  Abnormal gait, Decreased activity tolerance, Decreased balance, Decreased knowledge of use of DME, Decreased range of motion, Decreased strength, Difficulty walking, Postural dysfunction, Impaired flexibility  Visit Diagnosis: Difficulty in walking, not elsewhere classified  Other abnormalities of gait and mobility  Unsteadiness on feet     Problem List Patient Active Problem List   Diagnosis Date Noted   ALLERGIC RHINITIS 03/29/2010   Lurena Nida, PTA/CLT, WTA [phone]  Lurena Nida, PTA 02/09/2021, 10:47 AM  [facility] [address] [place], [place], [address] Phone: [phone]   Fax:  [phone]  Name: Hector Davies MRN:  [id number] Date of Birth: 1965-09-27

## 2021-02-13 ENCOUNTER — Other Ambulatory Visit: Payer: Self-pay

## 2021-02-13 ENCOUNTER — Ambulatory Visit (HOSPITAL_COMMUNITY): Payer: Medicaid Other | Admitting: Physical Therapy

## 2021-02-13 ENCOUNTER — Encounter (HOSPITAL_COMMUNITY): Payer: Self-pay | Admitting: Physical Therapy

## 2021-02-13 DIAGNOSIS — R262 Difficulty in walking, not elsewhere classified: Secondary | ICD-10-CM

## 2021-02-13 DIAGNOSIS — R2681 Unsteadiness on feet: Secondary | ICD-10-CM

## 2021-02-13 DIAGNOSIS — R2689 Other abnormalities of gait and mobility: Secondary | ICD-10-CM

## 2021-02-13 NOTE — Patient Instructions (Signed)
Access Code: [email or web address] URL: [email or web address] Date: 02/13/2021 Prepared by: Georges Lynch  Exercises Sit to Stand with Counter Support - 2 x daily - 7 x weekly - 2 sets - 10 reps Standing Hip Abduction with Counter Support - 2 x daily - 7 x weekly - 2 sets - 10 reps Standing Hip Extension with Counter Support - 2 x daily - 7 x weekly - 2 sets - 10 reps Standing Tandem Balance with Counter Support - 2 x daily - 7 x weekly - 1 sets - 3 reps - 30 second hold

## 2021-02-13 NOTE — Therapy (Signed)
[facility] [address] [place], [place], [address] Phone: [phone]   Fax:  [phone]  Physical Therapy Treatment  Patient Details  Name: Hector Davies MRN: [id number] Date of Birth: 1965-10-26 Referring Provider (PT): Glendale Chard, DO   Encounter Date: 02/13/2021   PT End of Session - 02/13/21 1304     Visit Number 8    Number of Visits 10    Date for PT Re-Evaluation 02/25/21    Authorization Type [place] Medicaid, approved 7 visits 01/27/21-02/23/21    Authorization Time Period 01/27/21--02/23/21    Authorization - Visit Number 5    Authorization - Number of Visits 7    Progress Note Due on Visit 10    PT Start Time 1303    PT Stop Time 1341    PT Time Calculation (min) 38 min    Activity Tolerance Patient tolerated treatment well    Behavior During Therapy [facility] for tasks assessed/performed             Past Medical History:  Diagnosis Date   Anxiety    COPD (chronic obstructive pulmonary disease) (HCC)    Depression    Gastric ulcer    Hypercholesterolemia     Past Surgical History:  Procedure Laterality Date   CATARACT EXTRACTION W/PHACO Right 11/13/2018   Procedure: CATARACT EXTRACTION PHACO AND INTRAOCULAR LENS PLACEMENT (IOC) RIGHT VISION BLUE;  Surgeon: Elliot Cousin, MD;  Location: [facility] OR[facility];  Service: Ophthalmology;  Laterality: Right;  [name]   01:18 CDE 12.46 Fluid pack lot # [id number]   NO PAST SURGERIES      There were no vitals filed for this visit.   Subjective Assessment - 02/13/21 1304     Subjective His Lt ankle bothers him    Currently in Pain? Yes    Pain Score 5     Pain Location Ankle    Pain Orientation Left    Pain Descriptors / Indicators Aching    Pain Type Acute pain                               [facility] Adult PT Treatment/Exercise - 02/13/21 0001       Lumbar Exercises: Standing   Heel Raises 20 reps    Other Standing Lumbar Exercises step taps 6 inch box x  20, semi tandem stance 3 x 20", step ups on 6 inch box with HHA x 2 20 reps    Other Standing Lumbar Exercises retrowalking 3RT, sidestepping with 3RT, standing hip abduction 2 x10, hip extension 2 x 10      Lumbar Exercises: Seated   Sit to Stand 20 reps    Sit to Stand Limitations 2 x 10 from standard height chair                       PT Short Term Goals - 02/02/21 1638       PT SHORT TERM GOAL #1   Title Patient will be independent with HEP in order to improve functional outcomes.    Time 2    Period Weeks    Status On-going    Target Date 01/18/21      PT SHORT TERM GOAL #2   Title Demo improved ambulation tolerance and safety per x 200 ft with least restrictve AD    Baseline 100 ft with cane and CGA    Time  2    Period Weeks    Status On-going    Target Date 01/18/21               PT Long Term Goals - 02/02/21 0935       PT LONG TERM GOAL #1   Title Demo improved balance and LE strength per 12 sec 5xSTS    Baseline 38 sec    Time 4    Period Weeks    Status On-going      PT LONG TERM GOAL #2   Title Demo improved balance and motor control per 10 sec SLS    Baseline unable    Time 4    Period Weeks    Status On-going                   Plan - 02/13/21 1338     Clinical Impression Statement Progressed LE strengthening with hip abduction and extension. Patient required verbal cues to keep knee extended and avoid flexing with hip extension. Patient also cued on mechanism and avoiding posterior lean with sit to stands. Patient noting mild fatigue end of session. No LOB with todays activity. Updated HEP and issued handout. Patient will continue to benefit from skilled therapy services to reduced deficits and improve function.    Personal Factors and Comorbidities Age;Time since onset of injury/illness/exacerbation;Comorbidity 1    Comorbidities Tbco abuse    Examination-Activity Limitations  Bend;Carry;Lift;Stand;Stairs;Squat;Locomotion Level;Transfers    Examination-Participation Restrictions Cleaning;Community Activity;Yard Work;Occupation;Meal Prep    Stability/Clinical Decision Making Stable/Uncomplicated    Rehab Potential Good    PT Frequency 2x / week    PT Duration 4 weeks    PT Treatment/Interventions ADLs/Self Care Home Management;Aquatic Therapy;Electrical Stimulation;DME Instruction;Traction;Moist Heat;Gait training;Functional mobility training;Therapeutic activities;Therapeutic exercise;Balance training;Patient/family education;Neuromuscular re-education;Manual techniques;Passive range of motion;Taping;Dry needling;Spinal Manipulations;Joint Manipulations    PT Next Visit Plan core/lower extremity strengthening supine progressing to standing, balance, developing HEP    PT Home Exercise Plan SKTC, DKTC, hamstring stretch; 11/14 ab set, bridge, prone SLR, prone lying; 12/1 ab set quad stretch 12/19 sit to stands, standing hip abduction and extension, tandem stance    Consulted and Agree with Plan of Care Patient             Patient will benefit from skilled therapeutic intervention in order to improve the following deficits and impairments:  Abnormal gait, Decreased activity tolerance, Decreased balance, Decreased knowledge of use of DME, Decreased range of motion, Decreased strength, Difficulty walking, Postural dysfunction, Impaired flexibility  Visit Diagnosis: Difficulty in walking, not elsewhere classified  Other abnormalities of gait and mobility  Unsteadiness on feet     Problem List Patient Active Problem List   Diagnosis Date Noted   ALLERGIC RHINITIS 03/29/2010   1:41 PM, 02/13/21 Georges Lynch PT DPT  Physical Therapist with [place]  [facility]  [phone]  [facility] [address] [place], [place], [address] Phone: [phone]   Fax:  [phone]  Name: Hector Davies MRN: [id number] Date of Birth: Apr 06, 1965

## 2021-02-16 ENCOUNTER — Telehealth (HOSPITAL_COMMUNITY): Payer: Self-pay | Admitting: Physical Therapy

## 2021-02-16 ENCOUNTER — Ambulatory Visit (HOSPITAL_COMMUNITY): Payer: Medicaid Other | Admitting: Physical Therapy

## 2021-02-16 NOTE — Telephone Encounter (Signed)
Patient scheduled with [facility] on 03/02/2021 at 02:30 PM.

## 2021-02-16 NOTE — Telephone Encounter (Signed)
Pt did not show for appt. Called and spoke to daughter and reminded of next appt on 12/28.  Lurena Nida, PTA/CLT, Margarita Rana [phone]

## 2021-02-22 ENCOUNTER — Encounter (HOSPITAL_COMMUNITY): Payer: Self-pay | Admitting: Physical Therapy

## 2021-02-22 ENCOUNTER — Ambulatory Visit (HOSPITAL_COMMUNITY): Payer: Medicaid Other | Admitting: Physical Therapy

## 2021-02-22 ENCOUNTER — Other Ambulatory Visit: Payer: Self-pay

## 2021-02-22 DIAGNOSIS — R2689 Other abnormalities of gait and mobility: Secondary | ICD-10-CM

## 2021-02-22 DIAGNOSIS — R262 Difficulty in walking, not elsewhere classified: Secondary | ICD-10-CM

## 2021-02-22 DIAGNOSIS — R2681 Unsteadiness on feet: Secondary | ICD-10-CM

## 2021-02-22 NOTE — Therapy (Signed)
[facility] [address] [place], [place], [address] Phone: [phone]   Fax:  [phone]  Physical Therapy Treatment  Patient Details  Name: Hector Davies MRN: [id number] Date of Birth: 30-Jun-1965 Referring Provider (PT): Glendale Chard, DO   Encounter Date: 02/22/2021  PHYSICAL THERAPY DISCHARGE SUMMARY  Visits from Start of Care: 9  Current functional level related to goals / functional outcomes: LE strength is wnl, Gait is wnl   Remaining deficits: balance   Education / Equipment: HEP   Patient agrees to discharge. Patient goals were partially met. Patient is being discharged due to the patient's request.   PT End of Session - 02/22/21 1118     Visit Number 9    Number of Visits 10    Date for PT Re-Evaluation 02/25/21    Authorization Type [place] Medicaid, approved 7 visits 01/27/21-02/23/21    Authorization Time Period 01/27/21--02/23/21    Authorization - Visit Number 6    Authorization - Number of Visits 7    Progress Note Due on Visit 10    PT Start Time 1050    PT Stop Time 1130    PT Time Calculation (min) 40 min    Activity Tolerance Patient tolerated treatment well    Behavior During Therapy WFL for tasks assessed/performed             Past Medical History:  Diagnosis Date   Anxiety    COPD (chronic obstructive pulmonary disease) (HCC)    Depression    Gastric ulcer    Hypercholesterolemia     Past Surgical History:  Procedure Laterality Date   CATARACT EXTRACTION W/PHACO Right 11/13/2018   Procedure: CATARACT EXTRACTION PHACO AND INTRAOCULAR LENS PLACEMENT (IOC) RIGHT VISION BLUE;  Surgeon: Elliot Cousin, MD;  Location: [facility] ORS;  Service: Ophthalmology;  Laterality: Right;  [name]   01:18 CDE 12.46 Fluid pack lot # [id number]   NO PAST SURGERIES      There were no vitals filed for this visit.   Subjective Assessment - 02/22/21 1054     Subjective PT states he has not been falling as much.  He  is going to a neurosurgeon next Friday.  He is doing his exercises about every other day.    Limitations Standing;House hold activities;Lifting    How long can you stand comfortably? 10 min    How long can you walk comfortably? 5-10 min    Diagnostic tests MRI L-spine    Currently in Pain? Yes    Pain Score 4     Pain Location Back    Pain Descriptors / Indicators Aching    Pain Type Acute pain    Pain Onset More than a month ago    Pain Frequency Intermittent    Aggravating Factors  walking and lifting    Pain Relieving Factors lying down    Effect of Pain on Daily Activities limits                [facility] PT Assessment - 02/22/21 0001       Assessment   Medical Diagnosis Spinal stenosis    Referring Provider (PT) Glendale Chard, DO      Home Environment   Living Environment Private residence    Living Arrangements Children    Available Help at Discharge Family    Type of Home House    Home Access Stairs to enter    Entrance Stairs-Number of Steps 4  Entrance Stairs-Rails None    Home Layout One level    [employer] - single point;Walker - 2 wheels      Prior Function   Level of Independence Independent    Vocation On disability      Functional Tests   Functional tests Single leg stance      Single Leg Stance   Comments 7" on Rt; 6: LT   was 0 B     Posture/Postural Control   Posture/Postural Control Postural limitations    Postural Limitations Increased thoracic kyphosis      AROM   Lumbar Flexion 10% restricted   was 10% restricted   Lumbar Extension 50% restricted   was 50% restricted   Lumbar - Right Side Bend WNL    Lumbar - Left Side Bend WNL      Strength   Overall Strength Within functional limits for tasks performed    Overall Strength Comments no myotome weakness perceived in sitting resisted tests    Right Hip Flexion 5/5    Right Hip Extension 4+/5    Right Hip ABduction 5/5    Left Hip Flexion 5/5   was 4+   Left Hip Extension  5/5    Left Hip ABduction 5/5    Right Knee Flexion 5/5   was 4+   Left Knee Flexion 5/5    Right Ankle Dorsiflexion 5/5    Left Ankle Dorsiflexion 5/5      Transfers   Transfers Sit to Stand;Stand Pivot Transfers    Sit to Stand 6: Modified independent (Device/Increase time)    Five time sit to stand comments  13.9 was 38 sec      Ambulation/Gait   Ambulation/Gait Yes    Ambulation/Gait Assistance 5: Supervision    Ambulation Distance (Feet) 400 Feet    Assistive device Straight cane    Gait Comments 2MWT                           [facility] Adult PT Treatment/Exercise - 02/22/21 0001       Lumbar Exercises: Stretches   Standing Extension 5 reps      Lumbar Exercises: Aerobic   Nustep level 3 x 5 min      Lumbar Exercises: Standing   Heel Raises 10 reps    Other Standing Lumbar Exercises tandem stance x 3 B    Other Standing Lumbar Exercises marching x 10      Lumbar Exercises: Seated   Sit to Stand 15 reps                       PT Short Term Goals - 02/22/21 1109       PT SHORT TERM GOAL #1   Title Patient will be independent with HEP in order to improve functional outcomes.    Time 2    Period Weeks    Status Achieved    Target Date 01/18/21      PT SHORT TERM GOAL #2   Title Demo improved ambulation tolerance and safety per 2MWT x 200 ft with least restrictve AD    Baseline 100 ft with cane and CGA    Time 2    Period Weeks    Status Achieved    Target Date 01/18/21               PT Long Term Goals - 02/22/21 1109  PT LONG TERM GOAL #1   Title Demo improved balance and LE strength per 12 sec 5xSTS    Baseline 38 sec    Time 4    Period Weeks    Status On-going      PT LONG TERM GOAL #2   Title Demo improved balance and motor control per 10 sec SLS    Baseline unable    Time 4    Period Weeks    Status On-going                   Plan - 02/22/21 1123     Clinical Impression Statement PT  reassesed; his strength and gait has improved greatly.  He is still having difficulty with balance but due to the fact that he is having transportation issues he would like to work on this at home which is agreeable with therapist.  PT HEP updated.    Personal Factors and Comorbidities Age;Time since onset of injury/illness/exacerbation;Comorbidity 1    Comorbidities Tbco abuse    Examination-Activity Limitations Bend;Carry;Lift;Stand;Stairs;Squat;Locomotion Level;Transfers    Examination-Participation Restrictions Cleaning;Community Activity;Yard Work;Occupation;Meal Prep    Stability/Clinical Decision Making Stable/Uncomplicated    Rehab Potential Good    PT Frequency 2x / week    PT Duration 4 weeks    PT Treatment/Interventions ADLs/Self Care Home Management;Aquatic Therapy;Electrical Stimulation;DME Instruction;Traction;Moist Heat;Gait training;Functional mobility training;Therapeutic activities;Therapeutic exercise;Balance training;Patient/family education;Neuromuscular re-education;Manual techniques;Passive range of motion;Taping;Dry needling;Spinal Manipulations;Joint Manipulations    PT Next Visit Plan Discharge.    PT Home Exercise Plan SKTC, DKTC, hamstring stretch; 11/14 ab set, bridge, prone SLR, prone lying; 12/1 ab set quad stretch 12/19 sit to stands, standing hip abduction and extension, tandem stance; 12/28 single leg stance and heel raises.    Consulted and Agree with Plan of Care Patient             Patient will benefit from skilled therapeutic intervention in order to improve the following deficits and impairments:  Abnormal gait, Decreased activity tolerance, Decreased balance, Decreased knowledge of use of DME, Decreased range of motion, Decreased strength, Difficulty walking, Postural dysfunction, Impaired flexibility  Visit Diagnosis: Difficulty in walking, not elsewhere classified  Other abnormalities of gait and mobility  Unsteadiness on feet     Problem  List Patient Active Problem List   Diagnosis Date Noted   ALLERGIC RHINITIS 03/29/2010   Rayetta Humphrey, PT CLT [phone]  02/22/2021, 11:26 AM  [place] [address] [place], [place], [address] Phone: [phone]   Fax:  [phone]  Name: Hector Davies MRN: [id number] Date of Birth: Feb 04, 1966

## 2021-02-23 ENCOUNTER — Ambulatory Visit (HOSPITAL_COMMUNITY): Payer: Medicaid Other | Admitting: Physical Therapy

## 2021-02-28 ENCOUNTER — Ambulatory Visit (HOSPITAL_COMMUNITY): Payer: Medicaid Other | Admitting: Physical Therapy

## 2021-03-02 ENCOUNTER — Encounter (HOSPITAL_COMMUNITY): Payer: Medicaid Other | Admitting: Physical Therapy

## 2021-03-08 ENCOUNTER — Encounter (HOSPITAL_COMMUNITY): Payer: Medicaid Other

## 2021-03-08 ENCOUNTER — Telehealth: Payer: Self-pay | Admitting: Neurology

## 2021-03-08 NOTE — Telephone Encounter (Signed)
Pt stated his job sent paperwork 02/21/21 and they haven't gotten it back asking if we can send it again because if we don't he will not get paid.  Pt stated at neuro surgery said surgery was not needed. Pt wants Dr Allena Katz to know he is falling 4 - 5 times a day,  Pt advised that we will call him back tomorrow with an up date.

## 2021-03-08 NOTE — Telephone Encounter (Signed)
Patient called back and left a voice mail requesting a call back about this.

## 2021-03-09 ENCOUNTER — Encounter (HOSPITAL_COMMUNITY): Payer: Medicaid Other | Admitting: Physical Therapy

## 2021-03-09 NOTE — Telephone Encounter (Signed)
Patient returned called and I informed him that I have not yet received any other papers from his Disability. I advised patient that I went ahead and faxed over the notes again. Patient asked if I could fax over his MRI results as well. Office notes and MRI's have been faxed and patient is aware of this. Patient had no further questions or concerns.

## 2021-03-09 NOTE — Telephone Encounter (Signed)
Patient called and left two voicemail's. Called patient back and left a message for patient to return my call.

## 2021-03-09 NOTE — Telephone Encounter (Signed)
I called patient and informed him that the only paperwork we have received was on 02/16/21 and that was faxed back same day. I advised patient to contact his work disability and have them fax it again or he may even have to bring it in to [name]. Informed patient that we ask patients to allow [name] at least a week to complete paperwork. Patient stated that he will call his job about his disability.  Patient was advised that we will need him to scheduled a f/u. Patient was transferred to the front to have his follow up scheduled.  Called [facility] at [phone] and have requested recent office notes to be faxed over to Dr. Allena Katz.

## 2021-03-09 NOTE — Telephone Encounter (Signed)
I am unable to see visits from [facility] in [facility], so will need to request Dr. Lindalou Hose note. Also, please call patient to set up a follow-up visit.

## 2021-03-09 NOTE — Telephone Encounter (Signed)
I have refaxed patients office notes to his disability company.

## 2021-03-13 ENCOUNTER — Encounter (HOSPITAL_COMMUNITY): Payer: Medicaid Other | Admitting: Physical Therapy

## 2021-03-16 ENCOUNTER — Encounter (HOSPITAL_COMMUNITY): Payer: Medicaid Other | Admitting: Physical Therapy

## 2021-03-22 DIAGNOSIS — Z0279 Encounter for issue of other medical certificate: Secondary | ICD-10-CM

## 2021-04-03 ENCOUNTER — Other Ambulatory Visit: Payer: Self-pay

## 2021-04-03 ENCOUNTER — Other Ambulatory Visit (INDEPENDENT_AMBULATORY_CARE_PROVIDER_SITE_OTHER): Payer: Medicaid Other

## 2021-04-03 ENCOUNTER — Ambulatory Visit: Payer: Medicaid Other | Admitting: Neurology

## 2021-04-03 ENCOUNTER — Encounter: Payer: Self-pay | Admitting: Neurology

## 2021-04-03 VITALS — BP 154/85 | HR 83 | Ht 69.0 in | Wt 224.0 lb

## 2021-04-03 DIAGNOSIS — G20C Parkinsonism, unspecified: Secondary | ICD-10-CM

## 2021-04-03 DIAGNOSIS — R2681 Unsteadiness on feet: Secondary | ICD-10-CM

## 2021-04-03 DIAGNOSIS — R292 Abnormal reflex: Secondary | ICD-10-CM

## 2021-04-03 DIAGNOSIS — G20 Parkinson's disease: Secondary | ICD-10-CM

## 2021-04-03 DIAGNOSIS — R251 Tremor, unspecified: Secondary | ICD-10-CM

## 2021-04-03 DIAGNOSIS — R29898 Other symptoms and signs involving the musculoskeletal system: Secondary | ICD-10-CM | POA: Diagnosis not present

## 2021-04-03 NOTE — Progress Notes (Signed)
Follow-up Visit   Date: 04/03/21   Hector Davies MRN: [id number] DOB: 08/21/1965   Interim History: Hector Davies is a 56 y.o. left-handed Caucasian male with hyperlipidemia, depression, COPD, anxiety, and tobacco abuse returning to the clinic for follow-up of gait imbalance and falls.  The patient was accompanied to the clinic by daughter who also provides collateral information.    History of present illness: Starting around the spring of 2022, he began having leg weakness, which he describes more as imbalance.  He looses his balance all the time tends to fall backwards and cannot always break his falls.  He says that he falls daily.  He walks unassisted.  He complains of low back pain. No numbness/tingling of the legs.  He lives at home with three daughters.    He smokes 2PPD x 45 years, previously smoking 4 PPD. He has remote history of drinking 6-12 pack per day x 10 years, stopped this 10 years ago.  He previously worked in the Product/process development scientist at [employer] and has been out of work since September.    UPDATE 04/03/2021:  Since his last visit, he has had extensive neurological evaluation including MRI neuroaxis, which has been notable for moderate lumbar canal stenosis at L3-4 and L3-5.  MRI brain, cervical and thoracic spine was unremarkable.  Neurosurgery was consulted and did not feel that symptoms were severe enough to be caused by his lumbar canal stenosis.  He went to physical therapy and did not feel that it helped, so stopped.  He continues to fall frequently, sometimes multiple times per day, other days, it may be several times per week.  He tends to always fall backwards and is unable to break this fall. Daughter states that his gait has become more shuffling and any slight unsteadiness which cause his to fall backwards. Patient also reports feeling lack of coordination and stiffness.   Medications:  Current Outpatient Medications on File Prior to Visit  Medication Sig  Dispense Refill   albuterol (VENTOLIN HFA) 108 (90 Base) MCG/ACT inhaler Inhale 2 puffs into the lungs 4 (four) times daily as needed.     cephALEXin (KEFLEX) 500 MG capsule Take 1 capsule (500 mg total) by mouth 4 (four) times daily. 28 capsule 0   cetirizine (ZYRTEC) 10 MG tablet Take 10 mg by mouth daily.     citalopram (CELEXA) 40 MG tablet Take 40 mg by mouth daily. 40 mg Daily     Glycopyrrolate-Formoterol (BEVESPI AEROSPHERE IN) Inhale 1 puff into the lungs 2 (two) times daily.     Multiple Vitamin (MULTIVITAMIN WITH MINERALS) TABS tablet Take 1 tablet by mouth daily.     naproxen (NAPROSYN) 500 MG tablet Take 1 pill twice a day as needed for pain and numbness 30 tablet 1   SPIRIVA RESPIMAT 2.5 MCG/ACT AERS Inhale 2 sprays into the lungs daily.     Calcium Citrate-Vitamin D (CALCIUM + D PO) Take 1 tablet by mouth daily.  (Patient not taking: Reported on 12/26/2020)     No current facility-administered medications on file prior to visit.    Allergies: No Known Allergies  Vital Signs:  BP (!) 154/85    Pulse 83    Ht 5\' 9"  (1.753 m)    Wt 224 lb (101.6 kg)    SpO2 97%    BMI 33.08 kg/m   Neurological Exam: MENTAL STATUS including orientation to time, place, person, recent and remote memory, attention span and concentration, language, and fund  of knowledge is normal.  Speech is not dysarthric.  CRANIAL NERVES:  No visual field defects.  Pupils equal round and reactive to light.  Normal conjugate, extra-ocular eye movements in all directions of gaze, except restricted upgaze bilaterally.  No ptosis .  Face is symmetric. Palate elevates symmetrically.  Tongue is midline. Jaw jerk, snout reflex, and palomental reflex is present bilaterally.   MOTOR:  Motor strength is 5/5 in all extremities. There is mild rigidity in the left arm and leg.  No atrophy or fasciculations.  Slight hand tremor when hands outstretched.      MSRs:  Reflexes are 2+/4 in the arms and 3+/4 at the knees and ankles  bilaterally.   SENSORY:  Intact to vibration throughout  COORDINATION/GAIT:  Normal finger-to- nose-finger.  Mild decrement in amplitude with finger and toe tapping bilaterally, worse on the right. Gait appears wide based, assisted with cane, small shuffling steps, appears stiff when walking.  Pull test positive.  Data: MRI cervical spine wo contrast 02/03/2021: 1. Motion degraded examination. 2. Multilevel cervical disc degeneration with mild spinal stenosis and moderate to severe neural foraminal stenosis at C5-6 and C6-7.  MRI thoracic spine wo contrast 02/03/2021: Mild thoracic spondylosis without stenosis.  MRI brain wo contrast 12/29/2020: 1. Mildly motion degraded exam with no acute intracranial abnormality.   2. Bilateral mastoid effusions with possible right middle ear opacification. Such mastoid fluid is most commonly  postinflammatory. But consider right side otitis media or cholesteatoma.  MRI lumbar spine wo contrast 12/29/2020: IMPRESSION: 1. 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 Moderate spinal stenosis at L3-L4 and L4-L5, mild at L1-L2 and L2-L3.   2. Disc degeneration most pronounced at L4-L5. Mild bilateral L3 and L4 neural foraminal stenosis.    IMPRESSION/PLAN: Gait instability and falls, possibly due to neurodegenerative condition such as progressive supranuclear palsy. Exam shows prominent upper motor neuron findings with hyperreflexia, increased tone, rigidity, and pathological facial reflexes. Additional he has restricted upgaze,fenestrating gait, and has a tendency to fall backwards.  Imaging of the neuroaxis does not show any structural abnormality of the brain or spinal cord.  I will proceed with evaluating him for atypical parkinson disease and also check labs for stiffperson syndrome and nutritional abnormalities.  PLAN/RECOMMENDATIONS:  Check DATscan Check GAD65, vitamin  B12, copper, folate, zinc Fall precautions discussed, suggested that consider a U-step walker and may try this at a medical supply store.  I will provide prescription, if they would like to file through their insurance  Return to clinic in 2 months.   Thank you for allowing me to participate in patient's care.  If I can answer any additional questions, I would be pleased to do so.    Sincerely,    Bambie Pizzolato K. Posey Pronto, DO

## 2021-04-03 NOTE — Patient Instructions (Addendum)
DAT scan  Check labs  Encouraged to use walker  Follow-up with your primary care doctor for blood pressure and COPD  You can try U-step walker at a medical supply walker  Return to clinic in 2 months

## 2021-04-12 LAB — B12 AND FOLATE PANEL
Folate: >24 ng/mL
Vitamin B-12: 542 pg/mL (ref 200–1100)

## 2021-04-12 LAB — GAD65, IA-2, AND INSULIN AUTOANTIBODY SERUM
Glutamic Acid Decarb Ab: <5 IU/mL (ref <5)
IA-2 Antibody: <5.4 U/mL (ref <5.4)
Insulin Antibodies, Human: <0.4 U/mL (ref <0.4)

## 2021-04-12 LAB — ZINC: Zinc: 64 ug/dL (ref 60–130)

## 2021-04-12 LAB — COPPER, SERUM: Copper: 102 ug/dL (ref 70–175)

## 2021-04-13 ENCOUNTER — Telehealth: Payer: Self-pay

## 2021-04-13 ENCOUNTER — Telehealth: Payer: Self-pay | Admitting: Neurology

## 2021-04-13 NOTE — Telephone Encounter (Signed)
Spoke to [place] at [employer] [phone] and initiated prior authorization for Dat Scan. Was informed a prior authorization is not needed and for any imaging. Ref# [id number]

## 2021-04-13 NOTE — Telephone Encounter (Signed)
Called [employer] at --- to initiate Dat scan prior approval and was informed that they no longer do prior authorization effective 08/2019. Asked representative for number of who I can contact to help me with patients Dat scan and she did not have a number to provide.   Called Number on patients card [facility] public health at [phone] and was transferred to clinical and received the voicemail. Left a message with patients name and birthday and requested a call back in regards to this matter.

## 2021-04-13 NOTE — Telephone Encounter (Signed)
Pt called in asking about his Dat Scan. He has never heard from anyone about setting that up

## 2021-04-13 NOTE — Telephone Encounter (Signed)
See previous encounter in regards to Dat Scan.    Called patient and informed him that we have been working on his prior approval and it has been a bit difficult with Medicaid. Informed patient that as soon as we get it done I will let him know. Patient verbalized understanding and had no further questions or concerns.

## 2021-04-27 ENCOUNTER — Encounter (HOSPITAL_COMMUNITY)
Admission: RE | Admit: 2021-04-27 | Discharge: 2021-04-27 | Disposition: A | Discharge status: Home / Self Care | Payer: Medicaid Other | Source: Ambulatory Visit | Attending: Neurology | Admitting: Neurology

## 2021-04-27 ENCOUNTER — Other Ambulatory Visit: Payer: Self-pay

## 2021-04-27 DIAGNOSIS — R251 Tremor, unspecified: Secondary | ICD-10-CM | POA: Insufficient documentation

## 2021-04-27 DIAGNOSIS — R292 Abnormal reflex: Secondary | ICD-10-CM

## 2021-04-27 DIAGNOSIS — R29898 Other symptoms and signs involving the musculoskeletal system: Secondary | ICD-10-CM | POA: Diagnosis present

## 2021-04-27 MED ORDER — POTASSIUM IODIDE (ANTIDOTE) 130 MG PO TABS: 130.0000 mg | ORAL_TABLET | Freq: Once | ORAL | Status: DC

## 2021-04-27 MED ORDER — IOFLUPANE I 123 185 MBQ/2.5ML IV SOLN
4.4000 | Freq: Once | INTRAVENOUS | Status: AC | PRN
  Administered 2021-04-27: 4.4 via INTRAVENOUS
  Filled 2021-04-27: qty 5

## 2021-04-27 MED ORDER — POTASSIUM IODIDE (ANTIDOTE) 130 MG PO TABS
ORAL_TABLET | ORAL | Status: AC
  Administered 2021-04-27: 130 mg via ORAL
  Filled 2021-04-27: qty 1

## 2021-04-28 ENCOUNTER — Telehealth: Payer: Self-pay | Admitting: Neurology

## 2021-04-28 NOTE — Telephone Encounter (Signed)
Patient called asking if the results had come back from his datscan. ?

## 2021-04-28 NOTE — Telephone Encounter (Signed)
Noted.  Results are not available and we will let him know. ?

## 2021-04-28 NOTE — Telephone Encounter (Signed)
Called and spoke to patient and informed him that we have not received the DAT Scan Results and as soon as we do we will give him a call.  ? ?Patient wanted to let Dr. Allena Katz know that he has been falling a lot more. He stated he fell 4 times yesterday and 2 times today. Patient also wanted to let Dr. Allena Katz know that he went to the doctor yesterday and he has a blockage his left ear and was prescribed some drops for it. Informed patient that I will let Dr. Allena Katz know about this and we will be on a look out for his Dat Scan Results.  ?

## 2021-05-02 NOTE — Progress Notes (Signed)
? ?  Virtual Visit via Video Note ?The purpose of this virtual visit is to provide medical care while limiting exposure to the novel coronavirus.   ? ?Consent was obtained for video visit:  Yes.   ?Answered questions that patient had about telehealth interaction:  Yes.   ?I discussed the limitations, risks, security and privacy concerns of performing an evaluation and management service by telemedicine. I also discussed with the patient that there may be a patient responsible charge related to this service. The patient expressed understanding and agreed to proceed. ? ?Pt location: Home ?Physician Location: office ?Name of referring provider:  No ref. provider found ?I connected with Hector Davies at patients initiation/request on 05/03/2021 at  9:50 AM EST by video enabled telemedicine application and verified that I am speaking with the correct person using two identifiers. ?Pt MRN:  [id number] ?Pt DOB:  11-Aug-1965 ?Video Participants:  Hector Davies ? ? ?History of Present Illness: This is a 56 y.o. male returning for follow-up of gait imbalance/falls and discuss results of DAT scan.  He underwent DAT scan last week which shows significantly reduced dopamine uptake, consistent with parkinson's disease.  He continues to have frequent falls, falling up to 5 times day because he is unable to break his falls and tends to fall backwards.  He also says that his shortterm disability has ran out and will be applying for social security benefits.  ? ? ?Observations/Objective:   ?There were no vitals filed for this visit. ?Patient is awake, alert, and appears comfortable.  Oriented x 4.   ?Extraocular muscles are intact, except restricted upgaze. No ptosis.  Face is symmetric.  Speech is not dysarthric.  ?Antigravity in all extremities.  Finger tapping slowed bilaterally. No pronator drift. ?Gait not tested. ? ? ?DAT scan 04/27/2021: ?Significant decreased striatal Ioflupane activity as above. This pattern can be seen in  Parkinsonian syndromes. ?  ?Of note, DaTSCAN is not diagnostic of Parkinsonian syndromes, which remains a clinical diagnosis. DaTscan is an adjuvant test to aid in the clinical diagnosis of Parkinsonian syndromes ? 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 He younger and age propensity to fall backwards tends to favor PSP.  Diagnosis and management options was discussed. ? ?PLAN/RECOMMENDATIONS:  ?Start sinemet 25/100 take 1 tablet three times daily.  Titration schedule will be provided. ?Unfortunately, he remains high risk for falls and medication may not significant improve this.  Gait assist device such as a walker may not prevent him from falling backwards, in which case wheelchair/electric power chair is recommended ?I will send home OT/PT referral for power chair evaluation.  ?All questions answered ? ? ?Follow Up Instructions: ?  ?I discussed the assessment and treatment plan with the patient. The patient was provided an opportunity to ask questions and all were answered. The patient agreed with the plan and demonstrated an understanding of the instructions. ?  ?The patient was advised to call back or seek an in-person evaluation if the symptoms worsen or if the condition fails to improve as anticipated. ? ?Follow-up in 1 month ? ?Glendale Chard, DO ? ?

## 2021-05-03 ENCOUNTER — Telehealth (INDEPENDENT_AMBULATORY_CARE_PROVIDER_SITE_OTHER): Payer: Medicaid Other | Admitting: Neurology

## 2021-05-03 DIAGNOSIS — R2681 Unsteadiness on feet: Secondary | ICD-10-CM | POA: Diagnosis not present

## 2021-05-03 DIAGNOSIS — R29898 Other symptoms and signs involving the musculoskeletal system: Secondary | ICD-10-CM | POA: Diagnosis not present

## 2021-05-03 DIAGNOSIS — G231 Progressive supranuclear ophthalmoplegia [Steele-Richardson-Olszewski]: Secondary | ICD-10-CM | POA: Diagnosis not present

## 2021-05-03 MED ORDER — CARBIDOPA-LEVODOPA 25-100 MG PO TABS: 1.0000 | ORAL_TABLET | Freq: Three times a day (TID) | ORAL | 3 refills | Status: DC

## 2021-05-04 ENCOUNTER — Other Ambulatory Visit: Payer: Self-pay

## 2021-05-04 DIAGNOSIS — G231 Progressive supranuclear ophthalmoplegia [Steele-Richardson-Olszewski]: Secondary | ICD-10-CM

## 2021-05-04 NOTE — Addendum Note (Signed)
Addended by: Karl Luke A on: 05/04/2021 03:51 PM ? ? Modules accepted: Orders ? ?

## 2021-05-04 NOTE — Addendum Note (Signed)
Addended by: Karl Luke A on: 05/04/2021 09:07 AM ? ? Modules accepted: Orders ? ?

## 2021-05-05 NOTE — Addendum Note (Signed)
Addended by: Karl Luke A on: 05/05/2021 09:10 AM ? ? Modules accepted: Orders ? ?

## 2021-06-06 ENCOUNTER — Encounter: Payer: Self-pay | Admitting: Ophthalmology

## 2021-06-07 ENCOUNTER — Encounter: Payer: Self-pay | Admitting: Neurology

## 2021-06-07 ENCOUNTER — Ambulatory Visit (INDEPENDENT_AMBULATORY_CARE_PROVIDER_SITE_OTHER): Payer: Medicaid Other | Admitting: Neurology

## 2021-06-07 VITALS — BP 152/85 | HR 77 | Ht 69.0 in | Wt 228.0 lb

## 2021-06-07 DIAGNOSIS — G231 Progressive supranuclear ophthalmoplegia [Steele-Richardson-Olszewski]: Secondary | ICD-10-CM

## 2021-06-07 NOTE — Progress Notes (Signed)
? ? ?Follow-up Visit ? ? ?Date: 06/07/21 ? ? ?Hector Davies ?MRN: [id number] ?DOB: 01/18/1966 ? ? ?Interim History: ?Hector Davies is a 56 y.o. left-handed Caucasian male with hyperlipidemia, depression, COPD, anxiety, and tobacco abuse returning to the clinic for follow-up of gait imbalance and falls.  The patient was accompanied to the clinic by self. ? ?History of present illness: ?Starting around the spring of 2022, he began having leg weakness, which he describes more as imbalance.  He looses his balance all the time tends to fall backwards and cannot always break his falls.  He says that he falls daily.  He walks unassisted.  He complains of low back pain. No numbness/tingling of the legs.  ?He lives at home with three daughters.  ?  ?He smokes 2PPD x 45 years, previously smoking 4 PPD. He has remote history of drinking 6-12 pack per day x 10 years, stopped this 10 years ago.  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   ? ?UPDATE 04/03/2021:  Since his last visit, he has had extensive neurological evaluation including MRI neuroaxis, which has been notable for moderate lumbar canal stenosis at L3-4 and L3-5.  MRI brain, cervical and thoracic spine was unremarkable.  Neurosurgery was consulted and did not feel that symptoms were severe enough to be caused by his lumbar canal stenosis.  He went to physical therapy and did not feel that it helped, so stopped.  He continues to fall frequently, sometimes multiple times per day, other days, it may be several times per week.  He tends to always fall backwards and is unable to break this fall. Daughter states that his gait has become more shuffling and any slight unsteadiness which cause his to fall backwards. Patient also reports feeling lack of coordination and stiffness.  ? ?UPDATE 06/07/2021:  He is here for follow-up visit.  At his last visit, I discussed the diagnosis of parkinson's disease based on his  DAT scan.  There has been no significant change in his symptoms since starting sinemet. He is using a Insurance underwriter at home but it tends run into things.  He continues to fall about twice daily, usually in the morning.  He requires assistance to stand up.  No problems with speech or swallow.  He has filed for social security disability and scheduled for medical evaluation in May.   ? ?Medications:  ?Current Outpatient Medications on File Prior to Visit  ?Medication Sig Dispense Refill  ? albuterol (VENTOLIN HFA) 108 (90 Base) MCG/ACT inhaler Inhale 2 puffs into the lungs 4 (four) times daily as needed.    ? carbidopa-levodopa (SINEMET IR) 25-100 MG tablet Take 1 tablet by mouth 3 (three) times daily. 270 tablet 3  ? cetirizine (ZYRTEC) 10 MG tablet Take 10 mg by mouth daily.    ? citalopram (CELEXA) 40 MG tablet Take 40 mg by mouth daily. 40 mg Daily    ? Ginkgo Biloba 40 MG TABS Take by mouth daily.    ? Glycopyrrolate-Formoterol (BEVESPI AEROSPHERE IN) Inhale 1 puff into the lungs 2 (two) times daily.    ? losartan (COZAAR) 25 MG tablet Take 25 mg by mouth daily.    ? Multiple Vitamin (MULTIVITAMIN WITH MINERALS) TABS tablet Take 1 tablet by mouth daily.    ? naproxen (NAPROSYN) 500 MG tablet Take 1 pill twice a day as needed for pain and numbness 30 tablet 1  ? omeprazole (PRILOSEC)  20 MG capsule Take 20 mg by mouth daily.    ? SPIRIVA RESPIMAT 2.5 MCG/ACT AERS Inhale 2 sprays into the lungs daily.    ? Calcium Citrate-Vitamin D (CALCIUM + D PO) Take 1 tablet by mouth daily.  (Patient not taking: Reported on 12/26/2020)    ? cephALEXin (KEFLEX) 500 MG capsule Take 1 capsule (500 mg total) by mouth 4 (four) times daily. (Patient not taking: Reported on 05/03/2021) 28 capsule 0  ? ?No current facility-administered medications on file prior to visit.  ? ? ?Allergies: No Known Allergies ? ?Vital Signs:  ?BP (!) 152/85   Pulse 77   Ht 5\' 9"  (1.753 m)   Wt 228 lb (103.4 kg)   SpO2 97%   BMI 33.67 kg/m?   ? ?Neurological Exam: ?MENTAL STATUS including orientation to time, place, person, recent and remote memory, attention span and concentration, language, and fund of knowledge is normal.  Speech is not dysarthric. ? ?CRANIAL NERVES:  No visual field defects.  Pupils equal round and reactive to light.  Normal conjugate, extra-ocular eye movements in all directions of gaze, except restricted upgaze bilaterally.  No ptosis .  Face is symmetric. Palate elevates symmetrically.   ? ?MOTOR:  Motor strength is 5/5 in all extremities. There is mild rigidity in the left arm and leg.  No atrophy or fasciculations.  Trace hand tremor when hands outstretched.     ? ?MSRs:  Reflexes are 2+/4 in the arms and 3+/4 at the knees and ankles bilaterally.  ? ?SENSORY:  Intact to vibration throughout ? ?COORDINATION/GAIT:  Normal finger-to- nose-finger.  Mild decrement in amplitude with finger tapping. Gait appears wide based, assisted with cane, small shuffling steps, appears stiff. ? ?Data: ?MRI cervical spine wo contrast 02/03/2021: ?1. Motion degraded examination. ?2. Multilevel cervical disc degeneration with mild spinal stenosis and moderate to severe neural foraminal stenosis at C5-6 and C6-7. ? ?MRI thoracic spine wo contrast 02/03/2021: Mild thoracic spondylosis without stenosis. ? ?MRI brain wo contrast 12/29/2020: ?1. Mildly motion degraded exam with no acute intracranial abnormality. ?  ?2. Bilateral mastoid effusions with possible right middle ear opacification. Such mastoid fluid is most commonly  postinflammatory. ?But consider right side otitis media or cholesteatoma. ? ?MRI lumbar spine wo contrast 12/29/2020: ?IMPRESSION: ?1. 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 ?Moderate spinal stenosis at L3-L4 and L4-L5, mild at L1-L2 and L2-L3. ?  ?2. Disc degeneration most pronounced at L4-L5. Mild bilateral L3 and L4 neural foraminal stenosis. ?   ? ?DAT scan 04/27/2021: ?Significant decreased striatal Ioflupane activity as above. This pattern can be seen in Parkinsonian syndromes. ?  ?Of note, DaTSCAN is not diagnostic of Parkinsonian syndromes, which remains a clinical diagnosis. DaTscan is an adjuvant test to aid in the clinical diagnosis of Parkinsonian syndromes ? 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 DAT scan is positive.  Unfortunately, management primary remains supportive.  ? ?PLAN/RECOMMENDATIONS:  ?Continue sinemet 25/100 1 tablet three times daily ?He is undergoing evaluation for powerchair and current has a 06/27/2021 ?Fall precautions discussed ?PT declined at this time, he may reconsider this once he has transportation assistance set up ? ?Return to clinic in 5 months.  ? ?Thank you for allowing me to participate in patient's care.  If I can answer any additional questions, I would be pleased  to do so.   ? ?Sincerely, ? ? ? ?Kayvon Mo K. Allena KatzPatel, DO ? ? ?

## 2021-06-07 NOTE — Patient Instructions (Signed)
Continue your medications as you are taking ? ?Return to clinic in September ?

## 2021-06-13 NOTE — Discharge Instructions (Signed)

## 2021-06-15 ENCOUNTER — Encounter
Admission: RE | Disposition: A | Discharge status: Home / Self Care | Payer: Self-pay | Source: Home / Self Care | Attending: Ophthalmology

## 2021-06-15 ENCOUNTER — Ambulatory Visit: Payer: Medicaid Other | Admitting: Anesthesiology

## 2021-06-15 ENCOUNTER — Other Ambulatory Visit: Payer: Self-pay

## 2021-06-15 ENCOUNTER — Ambulatory Visit
Admission: RE | Admit: 2021-06-15 | Discharge: 2021-06-15 | Disposition: A | Discharge status: Home / Self Care | Payer: Medicaid Other | Attending: Ophthalmology | Admitting: Ophthalmology

## 2021-06-15 DIAGNOSIS — H2512 Age-related nuclear cataract, left eye: Secondary | ICD-10-CM | POA: Diagnosis present

## 2021-06-15 DIAGNOSIS — F17210 Nicotine dependence, cigarettes, uncomplicated: Secondary | ICD-10-CM | POA: Insufficient documentation

## 2021-06-15 DIAGNOSIS — Z8711 Personal history of peptic ulcer disease: Secondary | ICD-10-CM | POA: Insufficient documentation

## 2021-06-15 DIAGNOSIS — J449 Chronic obstructive pulmonary disease, unspecified: Secondary | ICD-10-CM | POA: Diagnosis not present

## 2021-06-15 HISTORY — PX: CATARACT EXTRACTION W/PHACO: SHX586

## 2021-06-15 SURGERY — PHACOEMULSIFICATION, CATARACT, WITH IOL INSERTION
Anesthesia: Monitor Anesthesia Care | Site: Eye | Laterality: Left

## 2021-06-15 MED ORDER — SIGHTPATH DOSE#1 NA HYALUR & NA CHOND-NA HYALUR IO KIT
PACK | INTRAOCULAR | Status: DC | PRN
  Administered 2021-06-15: 1 via OPHTHALMIC

## 2021-06-15 MED ORDER — TETRACAINE 0.5 % OP SOLN OPTIME - NO CHARGE
OPHTHALMIC | Status: DC | PRN
Start: 2021-06-15 — End: 2021-06-15
  Administered 2021-06-15: 1 [drp] via OPHTHALMIC

## 2021-06-15 MED ORDER — MOXIFLOXACIN HCL 0.5 % OP SOLN
OPHTHALMIC | Status: DC | PRN
  Administered 2021-06-15: 0.2 mL via OPHTHALMIC

## 2021-06-15 MED ORDER — LIDOCAINE HCL (PF) 2 % IJ SOLN
INTRAOCULAR | Status: DC | PRN
  Administered 2021-06-15: 1 mL via INTRAOCULAR

## 2021-06-15 MED ORDER — ACETAMINOPHEN 325 MG PO TABS: 325.0000 mg | ORAL_TABLET | ORAL | Status: DC | PRN

## 2021-06-15 MED ORDER — TETRACAINE HCL 0.5 % OP SOLN
1.0000 [drp] | OPHTHALMIC | Status: DC | PRN
  Administered 2021-06-15 (×3): 1 [drp] via OPHTHALMIC

## 2021-06-15 MED ORDER — ACETAMINOPHEN 160 MG/5ML PO SOLN: 325.0000 mg | ORAL | Status: DC | PRN

## 2021-06-15 MED ORDER — MIDAZOLAM HCL 2 MG/2ML IJ SOLN
INTRAMUSCULAR | Status: DC | PRN
  Administered 2021-06-15: 2 mg via INTRAVENOUS

## 2021-06-15 MED ORDER — ARMC OPHTHALMIC DILATING DROPS
1.0000 "application " | OPHTHALMIC | Status: DC | PRN
  Administered 2021-06-15 (×3): 1 via OPHTHALMIC

## 2021-06-15 MED ORDER — ALBUTEROL SULFATE (2.5 MG/3ML) 0.083% IN NEBU
2.5000 mg | INHALATION_SOLUTION | Freq: Once | RESPIRATORY_TRACT | Status: AC
  Administered 2021-06-15: 2.5 mg via RESPIRATORY_TRACT

## 2021-06-15 MED ORDER — SIGHTPATH DOSE#1 BSS IO SOLN
INTRAOCULAR | Status: DC | PRN
Start: 2021-06-15 — End: 2021-06-15
  Administered 2021-06-15: 15 mL

## 2021-06-15 MED ORDER — DEXMEDETOMIDINE (PRECEDEX) IN NS 20 MCG/5ML (4 MCG/ML) IV SYRINGE
PREFILLED_SYRINGE | INTRAVENOUS | Status: DC | PRN
  Administered 2021-06-15: 10 ug via INTRAVENOUS

## 2021-06-15 MED ORDER — BRIMONIDINE TARTRATE-TIMOLOL 0.2-0.5 % OP SOLN
OPHTHALMIC | Status: DC | PRN
  Administered 2021-06-15: 1 [drp] via OPHTHALMIC

## 2021-06-15 MED ORDER — ONDANSETRON HCL 4 MG/2ML IJ SOLN: 4.0000 mg | Freq: Once | INTRAMUSCULAR | Status: DC | PRN

## 2021-06-15 MED ORDER — FENTANYL CITRATE (PF) 100 MCG/2ML IJ SOLN
INTRAMUSCULAR | Status: DC | PRN
  Administered 2021-06-15 (×2): 50 ug via INTRAVENOUS

## 2021-06-15 MED ORDER — SIGHTPATH DOSE#1 BSS IO SOLN
INTRAOCULAR | Status: DC | PRN
  Administered 2021-06-15: 62 mL via OPHTHALMIC

## 2021-06-15 SURGICAL SUPPLY — 15 items
CATARACT SUITE SIGHTPATH (MISCELLANEOUS) ×2 IMPLANT
DISSECTOR HYDRO NUCLEUS 50X22 (MISCELLANEOUS) ×2 IMPLANT
DRSG TEGADERM 2-3/8X2-3/4 SM (GAUZE/BANDAGES/DRESSINGS) ×2 IMPLANT
FEE CATARACT SUITE SIGHTPATH (MISCELLANEOUS) ×1 IMPLANT
GLOVE SURG GAMMEX PI TX LF 7.5 (GLOVE) ×2 IMPLANT
GLOVE SURG SYN 8.5  E (GLOVE) ×2
GLOVE SURG SYN 8.5 E (GLOVE) ×1 IMPLANT
GLOVE SURG SYN 8.5 PF PI (GLOVE) ×1 IMPLANT
LENS IOL TECNIS EYHANCE 19.5 (Intraocular Lens) ×1 IMPLANT
NDL FILTER BLUNT 18X1 1/2 (NEEDLE) ×1 IMPLANT
NEEDLE FILTER BLUNT 18X 1/2SAF (NEEDLE) ×1
NEEDLE FILTER BLUNT 18X1 1/2 (NEEDLE) ×1 IMPLANT
SYR 3ML LL SCALE MARK (SYRINGE) ×2 IMPLANT
SYR 5ML LL (SYRINGE) ×2 IMPLANT
WATER STERILE IRR 250ML POUR (IV SOLUTION) ×2 IMPLANT

## 2021-06-15 NOTE — H&P (Signed)
[facility]  ? ?Primary Care Physician:  System, Provider Not In ?Ophthalmologist: Dr. Merleen Nicely ? ?Pre-Procedure History & Physical: ?HPI:  Hector Davies is a 56 y.o. male here for cataract and KDB goniotomy surgery. ?  ?Past Medical History:  ?Diagnosis Date  ? Anxiety   ? COPD (chronic obstructive pulmonary disease) ([place])   ? Depression   ? Gastric ulcer   ? Hypercholesterolemia   ? ? ?Past Surgical History:  ?Procedure Laterality Date  ? CATARACT EXTRACTION W/PHACO Right 11/13/2018  ? Procedure: CATARACT EXTRACTION PHACO AND INTRAOCULAR LENS PLACEMENT ([place]) RIGHT VISION BLUE;  Surgeon: Marchia Meiers, MD;  Location: [facility] ORS;  Service: Ophthalmology;  Laterality: Right;  [name]   01:18 ?CDE 12.46 ?Fluid pack lot # [id number]  ? NO PAST SURGERIES    ? ? ?Prior to Admission medications   ?Medication Sig Start Date End Date Taking? Authorizing Provider  ?albuterol (VENTOLIN HFA) 108 (90 Base) MCG/ACT inhaler Inhale 2 puffs into the lungs 4 (four) times daily as needed. 05/29/18  Yes [provider]  ?Calcium Citrate-Vitamin D (CALCIUM + D PO) Take 1 tablet by mouth daily.   Yes [provider]  ?carbidopa-levodopa (SINEMET IR) 25-100 MG tablet Take 1 tablet by mouth 3 (three) times daily. 05/03/21  Yes Patel, Donika K, DO  ?cephALEXin (KEFLEX) 500 MG capsule Take 1 capsule (500 mg total) by mouth 4 (four) times daily. 01/13/21  Yes Sherrell Puller, PA-C  ?cetirizine (ZYRTEC) 10 MG tablet Take 10 mg by mouth daily. 12/14/20  Yes [provider]  ?citalopram (CELEXA) 40 MG tablet Take 40 mg by mouth daily. 40 mg Daily   Yes [provider]  ?Ginkgo Biloba 40 MG TABS Take by mouth daily.   Yes [provider]  ?Glycopyrrolate-Formoterol (BEVESPI AEROSPHERE IN) Inhale 1 puff into the lungs 2 (two) times daily.   Yes [provider]  ?losartan (COZAAR) 25 MG tablet Take 25 mg by mouth daily. 04/18/21  Yes [provider]  ?Multiple Vitamin (MULTIVITAMIN  WITH MINERALS) TABS tablet Take 1 tablet by mouth daily.   Yes [provider]  ?naproxen (NAPROSYN) 500 MG tablet Take 1 pill twice a day as needed for pain and numbness 06/04/20  Yes Milton Ferguson, MD  ?omeprazole (PRILOSEC) 20 MG capsule Take 20 mg by mouth daily. 04/18/21  Yes [provider]  ?SPIRIVA RESPIMAT 2.5 MCG/ACT AERS Inhale 2 sprays into the lungs daily. 05/29/18  Yes [provider]  ? ? ?Allergies as of 05/18/2021  ? (No Known Allergies)  ? ? ?Family History  ?Problem Relation Age of Onset  ? Uterine cancer Mother   ? Stroke Father   ? ? ?Social History  ? ?Socioeconomic History  ? Marital status: Legally Separated  ?  Spouse name: Not on file  ? Number of children: 6  ? Years of education: Not on file  ? Highest education level: Not on file  ?Occupational History  ? Not on file  ?Tobacco Use  ? Smoking status: Every Day  ?  Packs/day: 2.00  ?  Years: 42.00  ?  Pack years: 84.00  ?  Types: Cigarettes  ? Smokeless tobacco: Never  ?Vaping Use  ? Vaping Use: Never used  ?Substance and Sexual Activity  ? Alcohol use: Not Currently  ?  Comment: every couple weeks  ? Drug use: No  ? Sexual activity: Not on file  ?Other Topics Concern  ? Not on file  ?Social History Narrative  ?  Left Handed   ? Lives in a one story home   ? ?Social Determinants of Health  ? ?Financial Resource Strain: Not on file  ?Food Insecurity: Not on file  ?Transportation Needs: Not on file  ?Physical Activity: Not on file  ?Stress: Not on file  ?Social Connections: Not on file  ?Intimate Partner Violence: Not on file  ? ? ?Review of Systems: ?See HPI, otherwise negative ROS ? ?Physical Exam: ?BP (!) 147/92   Pulse 78   Temp (!) 97.3 ?F (36.3 ?C) (Temporal)   Resp (!) 22   Ht 5\' 9"  (1.753 m)   Wt 103 kg   SpO2 98%   BMI 33.52 kg/m?  ?General:   Alert, cooperative in NAD ?Head:  Normocephalic and atraumatic. ?Respiratory:  Normal work of breathing. ?Cardiovascular:  RRR ? ?Impression/Plan: ?Hector Davies is here for cataract and KDB goniotomy surgery. ? ?Risks, benefits, limitations, and alternatives regarding cataract surgery have been reviewed with the patient.  Questions have been answered.  All parties agreeable. ? ? ?Norvel Richards, MD  06/15/2021, 1:29 PM ? ? ?

## 2021-06-15 NOTE — Anesthesia Postprocedure Evaluation (Signed)
Anesthesia Post Note ? ?Patient: Hector Davies ? ?Procedure(s) Performed: CATARACT EXTRACTION PHACO AND INTRAOCULAR LENS PLACEMENT (IOC) LEFT 6.77 00:50.2 (Left: Eye) ? ? ?  ?Patient location during evaluation: PACU ?Anesthesia Type: MAC ?Level of consciousness: awake ?Pain management: pain level controlled ?Vital Signs Assessment: post-procedure vital signs reviewed and stable ?Respiratory status: respiratory function stable ?Cardiovascular status: stable ?Postop Assessment: no apparent nausea or vomiting ?Anesthetic complications: no ? ? ?No notable events documented. ? ?Veda Canning ? ? ? ? ? ?

## 2021-06-15 NOTE — Transfer of Care (Signed)
Immediate Anesthesia Transfer of Care Note ? ?Patient: Hector Davies ? ?Procedure(s) Performed: CATARACT EXTRACTION PHACO AND INTRAOCULAR LENS PLACEMENT (IOC) LEFT 6.77 00:50.2 (Left: Eye) ? ?Patient Location: PACU ? ?Anesthesia Type: MAC ? ?Level of Consciousness: awake, alert  and patient cooperative ? ?Airway and Oxygen Therapy: Patient Spontanous Breathing and Patient connected to supplemental oxygen ? ?Post-op Assessment: Post-op Vital signs reviewed, Patient's Cardiovascular Status Stable, Respiratory Function Stable, Patent Airway and No signs of Nausea or vomiting ? ?Post-op Vital Signs: Reviewed and stable ? ?Complications: No notable events documented. ? ?

## 2021-06-15 NOTE — Anesthesia Preprocedure Evaluation (Addendum)
Anesthesia Evaluation  ?Patient identified by MRN, date of birth, ID band ?Patient awake ? ? ? ?Reviewed: ?Allergy & Precautions, NPO status  ? ?Airway ?Mallampati: II ? ?TM Distance: >3 FB ? ? ? ? Dental ?  ?Pulmonary ?COPD, Current Smoker and Patient abstained from smoking.,  ?  ?Pulmonary exam normal ? ? ? ? ? ? ? Cardiovascular ?negative cardio ROS ? ? ?Rhythm:Regular Rate:Normal ? ? ?  ?Neuro/Psych ?PSYCHIATRIC DISORDERS Anxiety Depression   ? GI/Hepatic ?PUD,   ?Endo/Other  ? ? Renal/GU ?  ? ?  ?Musculoskeletal ? ? Abdominal ?  ?Peds ? Hematology ?  ?Anesthesia Other Findings ? ? Reproductive/Obstetrics ? ?  ? ? ? ? ? ? ? ? ? ? ? ? ? ?  ?  ? ? ? ? ? ? ? ? ?Anesthesia Physical ?Anesthesia Plan ? ?ASA: 2 ? ?Anesthesia Plan: MAC  ? ?Post-op Pain Management: Minimal or no pain anticipated  ? ?Induction: Intravenous ? ?PONV Risk Score and Plan: TIVA, Midazolam and Treatment may vary due to age or medical condition ? ?Airway Management Planned: Natural Airway and Nasal Cannula ? ?Additional Equipment:  ? ?Intra-op Plan:  ? ?Post-operative Plan:  ? ?Informed Consent: I have reviewed the patients History and Physical, chart, labs and discussed the procedure including the risks, benefits and alternatives for the proposed anesthesia with the patient or authorized representative who has indicated his/her understanding and acceptance.  ? ? ? ? ? ?Plan Discussed with: CRNA ? ?Anesthesia Plan Comments:   ? ? ? ? ? ? ?Anesthesia Quick Evaluation ? ?

## 2021-06-15 NOTE — Op Note (Addendum)
OPERATIVE NOTE ? ?Hector Davies ?[id number] ?06/15/2021 ? ? ?PREOPERATIVE DIAGNOSIS: Nuclear sclerotic cataract left eye. H25.12 ?  ?POSTOPERATIVE DIAGNOSIS: Nuclear sclerotic cataract left eye. H25.12 ?  ?PROCEDURE:  Phacoemusification with posterior chamber intraocular lens placement of the left eye  ?Ultrasound time: Procedure(s): ?CATARACT EXTRACTION PHACO AND INTRAOCULAR LENS PLACEMENT (IOC) LEFT 6.77 00:50.2 (Left) ? ?LENS:   ?Implant Name Type Inv. Item Serial No. Manufacturer Lot No. LRB No. Used Action  ?LENS IOL TECNIS EYHANCE 19.5 - [id number] Intraocular Lens LENS IOL TECNIS EYHANCE 19.5 [id number] [employer]  Left 1 Implanted  ?   ? ?SURGEON:  Julious Payer. Rolley Sims, MD ?  ?ANESTHESIA:  Topical with tetracaine drops, augmented with 1% preservative-free intracameral lidocaine. ?  ?COMPLICATIONS:  None. ?  ?DESCRIPTION OF PROCEDURE:  The patient was identified in the holding room and transported to the operating room and placed in the supine position under the operating microscope.  The left eye was identified as the operative eye, which was prepped and draped in the usual sterile ophthalmic fashion. ?  ?A 1 millimeter clear-corneal paracentesis was made inferotemporally. Preservative-free 1% lidocaine mixed with 1:1,000 bisulfite-free aqueous solution of epinephrine was injected into the anterior chamber. The anterior chamber was then filled with Viscoat viscoelastic. A 2.4 millimeter keratome was used to make a clear-corneal incision superotemporally. A curvilinear capsulorrhexis was made with a cystotome and capsulorrhexis forceps. The microscope was tilted in preparation for KDB goniotomy but upon inspection of the angle with the gonioscopy lens, the iridocorneal angle appeared to be completely closed off by peripheral anterior synechiae. It was thus decided to forego the KDB goniotomy and to continue with the cataract surgery. The microscope was tilted back to its original position. Balanced salt  solution was used to hydrodissect and hydrodelineate the nucleus. Phacoemulsification was then used to remove the lens nucleus and epinucleus. The remaining cortex was then removed using the irrigation and aspiration handpiece. Provisc was then placed into the capsular bag to distend it for lens placement. A+19.50 DIB00 intraocular lens was then injected into the capsular bag. The remaining viscoelastic was aspirated. ?  ?Wounds were hydrated with balanced salt solution.  The anterior chamber was inflated to a physiologic pressure with balanced salt solution.  No wound leaks were noted. Vigamox was injected intracamerally.  Timolol and Brimonidine drops were applied to the eye.  The patient was taken to the recovery room in stable condition without complications of anesthesia or surgery. ? ?Hector Davies ?06/15/2021, 2:22 PM ? ?

## 2021-06-19 ENCOUNTER — Encounter: Payer: Self-pay | Admitting: Ophthalmology

## 2021-07-04 ENCOUNTER — Ambulatory Visit (HOSPITAL_COMMUNITY): Payer: Medicaid Other | Attending: Neurology

## 2021-07-04 DIAGNOSIS — M6281 Muscle weakness (generalized): Secondary | ICD-10-CM | POA: Insufficient documentation

## 2021-07-06 ENCOUNTER — Ambulatory Visit (HOSPITAL_COMMUNITY): Payer: Medicaid Other | Admitting: Occupational Therapy

## 2021-07-20 ENCOUNTER — Encounter (HOSPITAL_COMMUNITY): Payer: Self-pay

## 2021-07-20 ENCOUNTER — Ambulatory Visit (HOSPITAL_COMMUNITY): Payer: Medicaid Other

## 2021-07-20 DIAGNOSIS — M6281 Muscle weakness (generalized): Secondary | ICD-10-CM | POA: Diagnosis present

## 2021-07-20 NOTE — Therapy (Signed)
OUTPATIENT OCCUPATIONAL THERAPY Wheelchair EVALUATION  Patient Name: Hector Davies MRN: [id number] DOB:[birth year], 56 y.o., male Today's Date: 07/20/2021   REFERRING PROVIDER: Narda Amber, DO   OT End of Session - 07/20/21 1625     Visit Number 1    Number of Visits 1    Authorization Type [place] medicaid    OT Start Time 1350    OT Stop Time 1450    OT Time Calculation (min) 60 min    Activity Tolerance Patient tolerated treatment well    Behavior During Therapy WFL for tasks assessed/performed             Past Medical History:  Diagnosis Date   Anxiety    COPD (chronic obstructive pulmonary disease) ([place])    Depression    Gastric ulcer    Hypercholesterolemia    Past Surgical History:  Procedure Laterality Date   CATARACT EXTRACTION W/PHACO Right 11/13/2018   Procedure: CATARACT EXTRACTION PHACO AND INTRAOCULAR LENS PLACEMENT ([place]) RIGHT VISION BLUE;  Surgeon: Marchia Meiers, MD;  Location: [facility];  Service: Ophthalmology;  Laterality: Right;  [name]   01:18 CDE 12.46 Fluid pack lot # [id number]   CATARACT EXTRACTION W/PHACO Left 06/15/2021   Procedure: CATARACT EXTRACTION PHACO AND INTRAOCULAR LENS PLACEMENT ([facility]) LEFT 6.77 00:50.2;  Surgeon: Norvel Richards, MD;  Location: [place];  Service: Ophthalmology;  Laterality: Left;   NO PAST SURGERIES     Patient Active Problem List   Diagnosis Date Noted   ALLERGIC RHINITIS 03/29/2010    THERAPY DIAG:  Muscle weakness (generalized)  Mobility/Seating Evaluation    PATIENT INFORMATION: Name: Hector Davies DOB: [birth year]  Sex: Male Date seen: 07/20/21 Time: 1:55PM  Address:  [address], [place] [address]   Physician: Narda Amber, DO This evaluation/justification form will serve as the LMN for the following suppliers: __________________________ Supplier: [employer] Contact Person: Casper Harrison Phone:  [phone]   Seating Therapist: Josue Hector Phone:    [phone]   Phone: [phone]    Spouse/Parent/Caregiver name:   Phone number:  Insurance/Payer: [place] Medicaid     Reason for Referral: To obtain a powered wheelchair  Patient/Caregiver Goals: To be able to move in his environment safely and independently while completing activities of daily living  Patient was seen for face-to-face evaluation for new power wheelchair.  Also present was [employer] to discuss recommendations and wheelchair options.  Further paperwork was completed and sent to vendor.  Patient appears to qualify for power mobility device at this time per objective findings.   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     HOME ENVIRONMENT: $RemoveBeforeDEI'[x]'ANqtGOdaYglmzqzY$ House  $Remo'[]'ocGBf$ Condo/town home  $Rem'[]'LeFN$ Apartment  $RemoveBe'[]'dqxnXdSqW$ Assisted Living    '[]'$ Lives Alone $RemoveBef'[x]'gSxEAZeHsG$  Lives with Others  Hours with caregiver:   [] Home is accessible to patient           Stairs      [x] Yes []  No     Ramp [] Yes [x] No Comments:  lives with 6 adult children, 2 granddaughter   COMMUNITY ADL: TRANSPORTATION: [x] Car    [] Van    [] Public Transportation    [] Adapted w/c Lift    [] Ambulance    [] Other:       [] Sits in wheelchair during transport  Employment/School:  Specific requirements pertaining to mobility   Other:     FUNCTIONAL/SENSORY PROCESSING SKILLS:  Handedness:   [] Right     [x] Left    [] NA  Comments:    Functional Processing Skills for Wheeled Mobility [x] Processing Skills are adequate for safe wheelchair operation  Areas of concern than may interfere with safe operation of  wheelchair Description of problem   []  Attention to environment      [] Judgment      []  Hearing  []  Vision or visual processing      [] Motor Planning  []  Fluctuations in Behavior      VERBAL COMMUNICATION: [x] WFL receptive []  WFL expressive [] Understandable  [] Difficult to understand  [] non-communicative []  Uses an augmented communication device  CURRENT SEATING / MOBILITY: Current Mobility Base:  [x] None [] Dependent [] Manual [] Scooter [] Power  Type of Control:   Manufacturer:  Size:  Age:   Current Condition of Mobility Base:     Current Wheelchair components:  Currently has a borrowed powered wheelchair from [place]  Describe posture in present seating system:        SENSATION and SKIN ISSUES: Sensation [] Intact  [x] Impaired [] Absent  Level of sensation: reports that his bilateral legs will become completely numb after standing for an extended period of time. Pressure Relief: Able to perform effective pressure relief :    [x] Yes  []  No Method: able to stand up to relieve pressure If not, Why?:   Skin Issues/Skin Integrity Current Skin Issues  [] Yes [x] No [] Intact []  Red area[]  Open Area  [] Scar Tissue [] At risk from prolonged sitting Where    History of Skin Issues  [] Yes [x] No Where   When    Hx of skin flap surgeries  [] Yes [x] No Where   When    Limited sitting tolerance [] Yes [x] No Hours spent sitting in wheelchair daily:   Complaint of Pain:  Please describe: Worse in morning. Will experience pain in the lower back and needs    Swelling/Edema:    ADL STATUS (in reference to wheelchair use):  Indep Mod I Assist Unable Indep with Equip Not assessed Comments  Dressing []  []  [x]  []  []  []  Needs assist to donn socks and shoes. Completes dressing seated.  Eating [x]  []  []  []  []  []    Toileting [x]  []  []  []  []  []    Bathing [x]  []  []  []  []  []  Completes in shower although is at a high risk of falling. Reports that several falls have occurred in the shower.     Grooming/Hygiene [x]  []  []  []  []  []    Meal Prep []  []  [x]  []  []  []  Daughter's take turns with preparing meals  IADLS [x]  []  []  []  []  []     Bowel Management: [] Continent  [x] Incontinent  [x] Accidents Comments:     Bladder Management: [] Continent  [x] Incontinent  [x] Accidents Comments:        WHEELCHAIR SKILLS: Manual w/c Propulsion: [] UE or LE strength and endurance sufficient to participate in ADLs using manual wheelchair Arm : [] left []   right   [] Both      Distance:  Foot:  [] left [] right   [] Both  Operate Scooter: []  Strength, hand grip, balance and transfer appropriate for use [] Living environment is accessible for use of scooter  Operate Power w/c:  []  Std. Joystick   []  Alternative Controls Indep [x]  Assist []  Dependent/unable []  N/A []   [x] Safe          [x]  Functional      Distance:   Bed confined without wheelchair []  Yes [x]  No   STRENGTH/RANGE OF MOTION:  A/ROM Range of Motion Strength  Shoulder Full ROM bilateral shoulders in all ranges 5/5: Bilateral shoulders all ranges.   Elbow Full ROM bilateral elbows in all ranges. 5/5: bilateral elbow flexion/extension  Wrist/Hand Full ROM bilateral wrists in all ranges.  5/5: bilateral wrist flexion/extension  Hip Bilateral hip flexion is WFL. 5/5: bilateral hip flexion  Knee Full bilateral knee flexion and extension 5/5: bilateral knee flexion/extension  Ankle Full bilateral ankle plantar flexion and dorsi flexion  5/5: bilateral ankle plantar flexion and dorsi flexion     MOBILITY/BALANCE:  []  Patient is totally dependent for mobility      Balance Transfers Ambulation  Sitting Balance: Standing Balance: [x]  Independent []  Independent/Modified Independent  [x]  WFL     [x]  WFL []  Supervision []  Supervision  []  Uses UE for balance  []  Supervision []  Min Assist []  Ambulates with Assist      []  Min Assist []  Min assist []  Mod Assist [x]  Ambulates with Device:      []  RW  []  StW  [x]  Cane  []    []  Mod Assist []  Mod assist []   Max assist   []  Max Assist []  Max assist []  Dependent []  Indep. Short Distance Only  []  Unable []  Unable []  Lift / Sling Required Distance (in feet)     []  Sliding board []  Unable to Ambulate (see explanation below)  Cardio Status:  [x] Intact  []  Impaired   []  NA       Respiratory Status:  [] Intact   [x] Impaired   [] NA     COPD  Orthotics/Prosthetics: None  Comments (Address manual vs power w/c vs scooter): Transfer: patient is able to complete transfers at [facility] using either his single point cane or both hands to push onto knees for support when moving from surface to surface. Transfers require increased time and slight momentum when transitioning from sit to stand.   Current home environment does not have the adequate space for turning radius that is required with a scooter. Due to COPD, patient is unable to propel himself in a manual wheelchair in order to interact with his home environment. Dynamic Gait Index performed. Score: 18/24 which is predictive of falls in the older community living adults.           Anterior / Posterior Obliquity Rotation-Pelvis   PELVIS    [x]  []  []   Neutral Posterior Anterior  [x]  []  []   WFL Rt elev Lt elev  [x]  []  []   WFL Right Left                      Anterior    Anterior     []  Fixed []  Other []  Partly Flexible []  Flexible   []  Fixed []  Other []  Partly Flexible  []  Flexible  []  Fixed []  Other []  Partly Flexible  []  Flexible   TRUNK  [x]  []  []   [facility]  Thoracic  Lumbar  Kyphosis Lordosis  [x]  []  []   WFL Convex Convex  Right Left [] c-curve [] s-curve [] multiple  [x]  Neutral []  Left-anterior []  Right-anterior     []  Fixed []  Flexible []  Partly Flexible []  Other  []  Fixed []  Flexible []  Partly Flexible []  Other  []  Fixed             []  Flexible []  Partly Flexible []  Other    Position Windswept    HIPS          [x]            []               []    Neutral       Abduct        ADduct         [x]           []            []   Neutral Right            Left      []  Fixed []  Subluxed []  Partly Flexible []  Dislocated []  Flexible  []  Fixed []  Other []  Partly Flexible  []  Flexible                 Foot Positioning Knee Positioning      [x]  WFL  [] Lt [] Rt [x]  WFL  [] Lt [] Rt    KNEES ROM concerns: ROM concerns:    & Dorsi-Flexed [] Lt [] Rt     FEET Plantar Flexed [] Lt [] Rt      Inversion                 [] Lt [] Rt      Eversion                 [] Lt [] Rt     HEAD [x]  Functional [x]  Good Head Control    & []  Flexed         []  Extended []  Adequate Head Control    NECK []  Rotated  Lt  []  Lat Flexed Lt []  Rotated  Rt []  Lat Flexed Rt []  Limited Head Control     []  Cervical Hyperextension []  Absent  Head Control     SHOULDERS ELBOWS WRIST& HAND       Left     Right    Left     Right    Left     Right   U/E [x] Functional           [x] Functional functional functional [] Fisting             [] Fisting      [] elev   [] dep      [] elev   [] dep       [] pro -[] retract     [] pro  [] retract [] subluxed             [] subluxed           Goals for Wheelchair Mobility  [x]  Independence with mobility in the home with motor related ADLs (MRADLs)  [x]  Independence with MRADLs in the community []  Provide dependent mobility  []  Provide recline     [] Provide tilt   Goals for Seating system []  Optimize pressure distribution []  Provide support needed to facilitate function or safety []  Provide corrective forces to assist with maintaining or improving posture []  Accommodate client's posture:   current seated postures and positions are not flexible or will not tolerate corrective forces []  Client to be independent with relieving pressure  in the wheelchair [] Enhance physiological function such as breathing, swallowing, digestion  Simulation ideas/Equipment trials: State why other equipment was unsuccessful:   MOBILITY BASE RECOMMENDATIONS and JUSTIFICATION: MOBILITY COMPONENT JUSTIFICATION  Manufacturer: [employer]: [employer]   Size: Width 18 Seat Depth  20 [x] provide transport from point A to B      [x] promote Indep mobility  [x] is not a safe, functional ambulator [] walker or cane inadequate [] non-standard width/depth necessary to accommodate anatomical measurement []    [] Manual Mobility Base [] non-functional ambulator    [] Scooter/POV  [] can safely operate  [] can safely transfer   [] has adequate trunk stability  [] cannot functionally propel manual w/c  [] Power Mobility Base  [] non-ambulatory  [] cannot functionally propel manual wheelchair  []  cannot functionally and safely operate scooter/POV [] can safely operate and willing to  [] Stroller Base [] infant/child  [] unable to propel manual wheelchair [] allows for growth [] non-functional ambulator [] non-functional UE [] Indep mobility is not a goal at this time  [] Tilt  [] Forward [] Backward [] Powered tilt  [] Manual tilt  [] change position against gravitational force on head and shoulders  [] change position for pressure relief/cannot weight shift [] transfers  [] management of tone [] rest periods [] control edema [] facilitate postural control  []    [] Recline  [] Power recline on power base [] Manual recline on manual base  [] accommodate femur to back angle  [] bring to full recline for ADL care  [] change position for pressure relief/cannot weight shift [] rest periods [] repositioning for transfers or clothing/diaper /catheter changes [] head positioning  [] Lighter weight required [] self- propulsion  [] lifting []    [] Heavy Duty required [] user weight greater than 250# [] extreme tone/ over active movement [] broken frame on previous chair []    []  Back  []  Angle Adjustable []  Custom molded  [] postural control [] control of tone/spasticity [] accommodation of range of motion [] UE functional control [] accommodation for seating system []   [] provide lateral trunk support [] accommodate deformity [] provide posterior trunk support [] provide lumbar/sacral support [] support trunk in  midline [] Pressure relief over spinal processes  [x]  Seat Cushion Standard Captain's Seat [] impaired sensation  [] decubitus ulcers present [] history of pressure ulceration [] prevent pelvic extension [] low maintenance  [] stabilize pelvis  [] accommodate obliquity [] accommodate multiple deformity [] neutralize lower extremity position [] increase pressure distribution []    []  Pelvic/thigh support  []  Lateral thigh guide []  Distal medial pad  []  Distal lateral pad []  pelvis in neutral [] accommodate pelvis []  position upper legs []  alignment []  accommodate ROM []  decr adduction [] accommodate tone [] removable for transfers [] decr abduction  []  Lateral trunk Supports []  Lt     []  Rt [] decrease lateral trunk leaning [] control tone [] contour for increased contact [] safety  [] accommodate asymmetry []    []  Mounting hardware  [] lateral trunk supports  [] back   [] seat [] headrest      []  thigh support [] fixed   [] swing away [] attach seat platform/cushion to w/c frame [] attach back cushion to w/c frame [] mount postural supports [] mount headrest  [] swing medial thigh support away [] swing lateral supports away for transfers  []      Armrests  [] fixed [x] adjustable height [] removable   [] swing away  [x] flip back   [] reclining [x] full length pads [] desk    [] pads tubular  [] provide support with elbow at 90   [] provide support for w/c tray [x] change of height/angles for variable activities [] remove for transfers [x] allow to come closer to table top [] remove for access to tables []    Hangers/ Leg rests  [] 60 [] 70 [] 90 [] elevating [] heavy duty  [] articulating [] fixed [] lift off [] swing away     [] power [] provide LE support  [] accommodate  to hamstring tightness [] elevate legs during recline   [] provide change in position for Legs [] Maintain placement of feet on footplate [] durability [] enable transfers [] decrease edema [] Accommodate lower leg length []    Foot support Footplate     [] Lt  []  Rt  []  Center mount [] flip up     [] depth/angle adjustable [] Amputee adapter    []  Lt     []  Rt [] provide foot support [] accommodate to ankle ROM [] transfers [] Provide support for residual extremity []  allow foot to go under wheelchair base []  decrease tone  []    []  Ankle strap/heel loops [] support foot on foot support [] decrease extraneous movement [] provide input to heel  [] protect foot  Tires: [] pneumatic  [] flat free inserts  [] solid  [] decrease maintenance  [] prevent frequent flats [] increase shock absorbency [] decrease pain from road shock [] decrease spasms from road shock []    []  Headrest  [] provide posterior head support [] provide posterior neck support [] provide lateral head support [] provide anterior head support [] support during tilt and recline [] improve feeding   [] improve respiration [] placement of switches [] safety  [] accommodate ROM  [] accommodate tone [] improve visual orientation  []  Anterior chest strap []  Vest []  Shoulder retractors  [] decrease forward movement of shoulder [] accommodation of TLSO [] decrease forward movement of trunk [] decrease shoulder elevation [] added abdominal support [] alignment [] assistance with shoulder control  []    Pelvic Positioner [] Belt [] SubASIS bar [] Dual Pull [] stabilize tone [] decrease falling out of chair/ **will not Decr potential for sliding due to pelvic tilting [] prevent excessive rotation [] pad for protection over boney prominence [] prominence comfort [] special pull angle to control rotation []    Upper Extremity Support [] L   []  R [] Arm trough    [] hand support []  tray       [] full tray [] swivel mount [] decrease edema      [] decrease subluxation   [] control tone   [] placement for AAC/Computer/EADL [] decrease gravitational pull on shoulders [] provide midline positioning [] provide support to increase UE function [] provide hand support in natural position [] provide work surface   POWER  WHEELCHAIR CONTROLS  [x] Proportional  [] Non-Proportional Type joystick [x] Left  [] Right [x] provides access for controlling wheelchair   [] lacks motor control to operate proportional drive control [] unable to understand proportional controls  Actuator Control Module  [] Single  [] Multiple   [] Allow the client to operate the power seat function(s) through the joystick control   [] Safety Reset Switches [] Used to change modes and stop the wheelchair when driving in latch mode    [] Upgraded Electronics   [] programming for accurate control [] progressive Disease/changing condition [] non-proportional drive control needed [] Needed in order to operate power seat functions through joystick control   [] Display box [] Allows user to see in which mode and drive the wheelchair is set  [] necessary for alternate controls    [] Digital interface electronics [] Allows w/c to operate when using alternative drive controls  [] ASL Head Array [] Allows client to operate wheelchair  through switches placed in tri-panel headrest  [] Sip and puff with tubing kit [] needed to operate sip and puff drive controls  [] Upgraded tracking electronics [] increase safety when driving [] correct tracking when on uneven surfaces  [x] Mount for switches or joystick [] Attaches switches to w/c  [x] Swing away for access or transfers [] midline for optimal placement [] provides for consistent access  [] Attendant controlled joystick plus mount [] safety [] long distance driving [] operation of seat functions [] compliance with transportation regulations []      Rear wheel placement/Axle adjustability [] None [] semi adjustable [] fully adjustable  [] improved UE access to wheels [] improved stability [] changing angle in space for improvement  of postural stability [] 1-arm drive access [] amputee pad placement []    Wheel rims/ hand rims  [] metal  [] plastic coated [] oblique projections [] vertical projections [] Provide ability to propel manual  wheelchair  []  Increase self-propulsion with hand weakness/decreased grasp  Push handles [] extended  [] angle adjustable  [] standard [] caregiver access [] caregiver assist [] allows "hooking" to enable increased ability to perform ADLs or maintain balance  One armed device  [] Lt   [] Rt [] enable propulsion of manual wheelchair with one arm   []      Brake/wheel lock extension []  Lt   []  Rt [] increase indep in applying wheel locks   [] Side guards [] prevent clothing getting caught in wheel or becoming soiled []  prevent skin tears/abrasions  Battery: U1 X2 [x] to power wheelchair   Other:     The above equipment has a life- long use expectancy. Growth and changes in medical and/or functional conditions would be the exceptions. This is to certify that the therapist has no financial relationship with durable medical provider or manufacturer. The therapist will not receive remuneration of any kind for the equipment recommended in this evaluation.   Patient has mobility limitation that significantly impairs safe, timely participation in one or more mobility related ADL's.  (bathing, toileting, feeding, dressing, grooming, moving from room to room)                                                             [x]  Yes []  No Will mobility device sufficiently improve ability to participate and/or be aided in participation of MRADL's?         [x]  Yes []  No Can limitation be compensated for with use of a cane or walker?                                                                                []  Yes [x]  No Does patient or caregiver demonstrate ability/potential ability & willingness to safely use the mobility device?   [x]  Yes []  No Does patient's home environment support use of recommended mobility device?                                                    [x]  Yes []  No Does patient have sufficient upper extremity function necessary to functionally propel a manual wheelchair?    []  Yes [x]  No Does patient have  sufficient strength and trunk stability to safely operate a POV (scooter)?                                  []  Yes [x]  No Does patient need additional features/benefits provided by a power wheelchair for MRADL's in the home?       [x]  Yes []  No Does the patient demonstrate the ability to safely use a power  wheelchair?                                                              [x]  Yes []  No  Therapist Name Printed: Ailene Ravel OTR/L, CBIS Date: 07/20/21  Therapist's Signature:   Date:   Supplier's Name Printed:  Date:   Supplier's Signature:   Date:  Patient/Caregiver Signature:   Date:     This is to certify that I have read this evaluation and do agree with the content within:   67 Name Printed:   64 Signature:  Date:     This is to certify that I, the above signed therapist have the following affiliations: []  This DME provider []  Manufacturer of recommended equipment []  Patient's long term care facility [x]  None of the above    PLAN: OT FREQUENCY: one time visit    CONSULTED AND AGREED WITH PLAN OF CARE: Patient  PLAN FOR NEXT SESSION: See Letter of medical necessity above  Ailene Ravel, OTR/L,CBIS  Supplemental OT - [place] and [facility]  07/20/2021, 4:27 PM

## 2021-08-02 ENCOUNTER — Telehealth: Payer: Self-pay | Admitting: Neurology

## 2021-08-02 NOTE — Telephone Encounter (Signed)
Pt called no answer left a voice mail to call back  

## 2021-08-02 NOTE — Telephone Encounter (Signed)
Patient left message with access nurse. He states he was diagnosed with parkinsons after a scan.  He states that for disability he needs a confirmation of his diagnosis.

## 2021-08-03 NOTE — Telephone Encounter (Signed)
Pt stated that he needs a letter or paperwork for disability he said he is going to go by there and see if they have the paperwork he needs and bring it to the office

## 2021-08-09 ENCOUNTER — Telehealth: Payer: Self-pay | Admitting: Neurology

## 2021-08-09 NOTE — Telephone Encounter (Signed)
Patient called and stated he needs paperwork showing his parkinsons diagnosis.  Please give him a call back.

## 2021-08-09 NOTE — Telephone Encounter (Signed)
Pt was called an given the number to [facility] medical records so that he can request his records to be sent to who he needs them to be sent to ,

## 2021-08-09 NOTE — Telephone Encounter (Signed)
We are unable to do a letter, but he can request for a copy of his last clinic note with Dr. Allena Katz where the diagnosis is written down.

## 2021-09-13 ENCOUNTER — Other Ambulatory Visit: Payer: Self-pay

## 2021-09-13 ENCOUNTER — Telehealth: Payer: Self-pay | Admitting: Neurology

## 2021-09-13 DIAGNOSIS — G231 Progressive supranuclear ophthalmoplegia [Steele-Richardson-Olszewski]: Secondary | ICD-10-CM

## 2021-09-13 MED ORDER — CARBIDOPA-LEVODOPA 25-100 MG PO TABS: 1.0000 | ORAL_TABLET | Freq: Three times a day (TID) | ORAL | 0 refills | Status: DC

## 2021-09-13 NOTE — Telephone Encounter (Signed)
Medication has been sent and patient called and made aware of the refill

## 2021-09-13 NOTE — Telephone Encounter (Signed)
Patient needs a refill of his carbi/levo 25-100. He wants a call back so he will know it was sent  [facility]

## 2021-11-08 ENCOUNTER — Encounter: Payer: Self-pay | Admitting: Neurology

## 2021-11-08 ENCOUNTER — Ambulatory Visit: Payer: Medicaid Other | Admitting: Neurology

## 2021-11-08 VITALS — BP 158/83 | HR 75 | Ht 69.0 in | Wt 221.0 lb

## 2021-11-08 DIAGNOSIS — G231 Progressive supranuclear ophthalmoplegia [Steele-Richardson-Olszewski]: Secondary | ICD-10-CM | POA: Diagnosis not present

## 2021-11-08 DIAGNOSIS — R1310 Dysphagia, unspecified: Secondary | ICD-10-CM | POA: Diagnosis not present

## 2021-11-08 DIAGNOSIS — R6339 Other feeding difficulties: Secondary | ICD-10-CM

## 2021-11-08 MED ORDER — CARBIDOPA-LEVODOPA 25-100 MG PO TABS: 1.0000 | ORAL_TABLET | Freq: Three times a day (TID) | ORAL | 3 refills | Status: DC

## 2021-11-08 NOTE — Progress Notes (Signed)
Follow-up Visit   Date: 11/08/21   Hector Davies MRN: [id number] DOB: 13-Dec-1965   Interim History: Hector Davies is a 56 y.o. left-handed Caucasian male with hyperlipidemia, depression, COPD, anxiety, and tobacco abuse returning to the clinic for follow-up of progressive nuclear palsy.  The patient was accompanied to the clinic by self.  History of present illness: Starting around the spring of 2022, he began having leg weakness, which he describes more as imbalance.  He looses his balance all the time tends to fall backwards and cannot always break his falls.  He says that he falls daily.  He walks unassisted.  He complains of low back pain. No numbness/tingling of the legs.  He lives at home with three daughters.    He smokes 2PPD x 45 years, previously smoking 4 PPD. He has remote history of drinking 6-12 pack per day x 10 years, stopped this 10 years ago.  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    UPDATE 04/03/2021:  Since his last visit, he has had extensive neurological evaluation including MRI neuroaxis, which has been notable for moderate lumbar canal stenosis at L3-4 and L3-5.  MRI brain, cervical and thoracic spine was unremarkable.  Neurosurgery was consulted and did not feel that symptoms were severe enough to be caused by his lumbar canal stenosis.  He went to physical therapy and did not feel that it helped, so stopped.  He continues to fall frequently, sometimes multiple times per day, other days, it may be several times per week.  He tends to always fall backwards and is unable to break this fall. Daughter states that his gait has become more shuffling and any slight unsteadiness which cause his to fall backwards. Patient also reports feeling lack of coordination and stiffness.   UPDATE 06/07/2021:  He is here for follow-up visit.  At his last visit, I discussed the diagnosis of parkinson's disease based on his  DAT scan.  There has been no significant change in his symptoms since starting sinemet. He is using a Insurance underwriter at home but it tends run into things.  He continues to fall about twice daily, usually in the morning.  He requires assistance to stand up.  No problems with speech or swallow.  He has filed for social security disability and scheduled for medical evaluation in May.    UPDATE 11/08/2021:  He is here for follow-up visit.  He has been having increased difficulty with swallowing solids and liquids.  Daughter says he has several choking spells. He continues to fall daily and has superficial injuries.  He tries to stay active and continues to mow the grass, even though family has asked him not to. He will be getting a smaller powerchair because the current one is too large for his home.  His disability was denied, however, it did not include my notes regarding his parkinson's disease.  He has appealed it and is in the process of waiting for this.  He does disclose having financial hardship as he is unable to work.   Medications:  Current Outpatient Medications on File Prior to Visit  Medication Sig Dispense Refill   albuterol (VENTOLIN HFA) 108 (90 Base) MCG/ACT inhaler Inhale 2 puffs into the lungs 4 (four) times daily as needed.     Calcium Citrate-Vitamin D (CALCIUM + D PO) Take 1 tablet by mouth daily.     carbidopa-levodopa (SINEMET IR) 25-100  MG tablet Take 1 tablet by mouth 3 (three) times daily. 270 tablet 0   cetirizine (ZYRTEC) 10 MG tablet Take 10 mg by mouth daily.     citalopram (CELEXA) 40 MG tablet Take 40 mg by mouth daily. 40 mg Daily     Ginkgo Biloba 40 MG TABS Take by mouth daily.     Glycopyrrolate-Formoterol (BEVESPI AEROSPHERE IN) Inhale 1 puff into the lungs 2 (two) times daily.     losartan (COZAAR) 25 MG tablet Take 25 mg by mouth daily.     Multiple Vitamin (MULTIVITAMIN WITH MINERALS) TABS tablet Take 1 tablet by mouth daily.     naproxen (NAPROSYN) 500 MG  tablet Take 1 pill twice a day as needed for pain and numbness 30 tablet 1   omeprazole (PRILOSEC) 20 MG capsule Take 20 mg by mouth daily.     SPIRIVA RESPIMAT 2.5 MCG/ACT AERS Inhale 2 sprays into the lungs daily.     cephALEXin (KEFLEX) 500 MG capsule Take 1 capsule (500 mg total) by mouth 4 (four) times daily. (Patient not taking: Reported on 11/08/2021) 28 capsule 0   No current facility-administered medications on file prior to visit.    Allergies: No Known Allergies  Vital Signs:  BP (!) 158/83   Pulse 75   Ht 5\' 9"  (1.753 m)   Wt 221 lb (100.2 kg)   SpO2 96%   BMI 32.64 kg/m   Neurological Exam: MENTAL STATUS including orientation to time, place, person, recent and remote memory, attention span and concentration, language, and fund of knowledge is normal.  Speech is not dysarthric.  CRANIAL NERVES:   Normal conjugate, extra-ocular eye movements in all directions of gaze, except restricted upgaze bilaterally.  No ptosis .    MOTOR:  Motor strength is 5/5 in all extremities. There is mild rigidity in the left arm and leg.  No atrophy or fasciculations.  Trace hand tremor when hands outstretched.      COORDINATION/GAIT:  Normal finger-to- nose-finger.  Mild decrement in amplitude with finger tapping. Gait appears wide based, assisted with cane, small shuffling steps, appears stiff and ataxic.  Data: MRI cervical spine wo contrast 02/03/2021: 1. Motion degraded examination. 2. Multilevel cervical disc degeneration with mild spinal stenosis and moderate to severe neural foraminal stenosis at C5-6 and C6-7.  MRI thoracic spine wo contrast 02/03/2021: Mild thoracic spondylosis without stenosis.  MRI brain wo contrast 12/29/2020: 1. Mildly motion degraded exam with no acute intracranial abnormality.   2. Bilateral mastoid effusions with possible right middle ear opacification. Such mastoid fluid is most commonly  postinflammatory. But consider right side otitis media or  cholesteatoma.  MRI lumbar spine wo contrast 12/29/2020: IMPRESSION: 1. 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 Moderate spinal stenosis at L3-L4 and L4-L5, mild at L1-L2 and L2-L3.   2. Disc degeneration most pronounced at L4-L5. Mild bilateral L3 and L4 neural foraminal stenosis.    DAT scan 04/27/2021: Significant decreased striatal Ioflupane activity as above. This pattern can be seen in Parkinsonian syndromes.   Of note, DaTSCAN is not diagnostic of Parkinsonian syndromes, which remains a clinical diagnosis. 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  Clinically, he has vertical gaze paresis, fenestrating gait, rigidity, bradykinesia, and frequent falls backwards. DAT scan is positive.  Today, he is reporting new complaints of dysphagia with  solids and liquids.  I will check swallow evaluation with speech therapy.    PLAN/RECOMMENDATIONS:  Modified barium swallow Continue sinemet 25/100 1 tablet three times daily He is very high risk of falling. Strongly advised patient to avoid walking and take extra precautions with positional changes and uneven ground. He has a power wheelchair at home.  Return to clinic in 6 months  Thank you for allowing me to participate in patient's care.  If I can answer any additional questions, I would be pleased to do so.    Sincerely,    Kensleigh Gates K. Allena Katz, DO

## 2021-11-08 NOTE — Patient Instructions (Signed)
Swallow study Avoid walking, try to use your powerchair as much as possible Continue sinemet as you are taking  Return to clinic in 6 months

## 2021-11-15 NOTE — Addendum Note (Signed)
Addended by: Armen Pickup A on: 11/15/2021 03:29 PM   Modules accepted: Orders

## 2022-03-01 ENCOUNTER — Other Ambulatory Visit (HOSPITAL_COMMUNITY): Payer: Self-pay | Admitting: Family Medicine

## 2022-03-01 DIAGNOSIS — J449 Chronic obstructive pulmonary disease, unspecified: Secondary | ICD-10-CM

## 2022-03-15 ENCOUNTER — Encounter (HOSPITAL_COMMUNITY): Payer: Self-pay

## 2022-03-15 ENCOUNTER — Ambulatory Visit (HOSPITAL_COMMUNITY): Payer: Medicaid Other

## 2022-03-27 ENCOUNTER — Emergency Department (HOSPITAL_COMMUNITY)
Admission: EM | Admit: 2022-03-27 | Discharge: 2022-03-28 | Disposition: A | Discharge status: Home / Self Care | Payer: Medicaid Other | Attending: Emergency Medicine | Admitting: Emergency Medicine

## 2022-03-27 ENCOUNTER — Encounter (HOSPITAL_COMMUNITY): Payer: Self-pay

## 2022-03-27 ENCOUNTER — Other Ambulatory Visit: Payer: Self-pay

## 2022-03-27 DIAGNOSIS — S90511A Abrasion, right ankle, initial encounter: Secondary | ICD-10-CM | POA: Insufficient documentation

## 2022-03-27 DIAGNOSIS — S60811A Abrasion of right wrist, initial encounter: Secondary | ICD-10-CM | POA: Diagnosis not present

## 2022-03-27 DIAGNOSIS — J449 Chronic obstructive pulmonary disease, unspecified: Secondary | ICD-10-CM | POA: Diagnosis not present

## 2022-03-27 DIAGNOSIS — Y92481 Parking lot as the place of occurrence of the external cause: Secondary | ICD-10-CM | POA: Diagnosis not present

## 2022-03-27 DIAGNOSIS — W19XXXA Unspecified fall, initial encounter: Secondary | ICD-10-CM | POA: Diagnosis not present

## 2022-03-27 DIAGNOSIS — G20C Parkinsonism, unspecified: Secondary | ICD-10-CM | POA: Insufficient documentation

## 2022-03-27 DIAGNOSIS — S0990XA Unspecified injury of head, initial encounter: Secondary | ICD-10-CM | POA: Diagnosis not present

## 2022-03-27 HISTORY — DX: Parkinson's disease without dyskinesia, without mention of fluctuations: G20.A1

## 2022-03-27 NOTE — ED Triage Notes (Addendum)
Pt reports he fell backwards in [employer] parking lot hitting the back of his head on the pavement approx 1 hour ago. States he thinks he may have "blacked out" for a few seconds and felt very dizzy afterwards. Denies taking blood thinners. Also c/o right wrist and right ankle pain.

## 2022-03-28 ENCOUNTER — Emergency Department (HOSPITAL_COMMUNITY): Payer: Medicaid Other

## 2022-03-28 NOTE — ED Notes (Signed)
Patient transported to CT and X ray 

## 2022-03-28 NOTE — Discharge Instructions (Signed)
You were seen today after a fall.  Your x-rays and CT imaging are reassuring.  There is no head bleed.  Take ibuprofen or Tylenol as needed for pain.  Your x-rays do not show any fractures of the wrist or ankle.

## 2022-03-28 NOTE — ED Provider Notes (Signed)
[facility] Provider Note   CSN: [id number] Arrival date & time: 03/27/22  2327     History  No chief complaint on file.   ELIGH RYBACKI is a 57 y.o. male.  HPI     This is a 57 year old male with a history of Parkinson's disease who presents following a fall.  Patient reports that at baseline he has an unsteady gait and walks with a cane.  He fell in the parking lot striking his head.  He is unsure whether he lost consciousness.  Has some tenderness to the back of the head.  Also reports right wrist and right ankle pain.  He is not on any blood thinners.  Home Medications Prior to Admission medications   Medication Sig Start Date End Date Taking? Authorizing Provider  albuterol (VENTOLIN HFA) 108 (90 Base) MCG/ACT inhaler Inhale 2 puffs into the lungs 4 (four) times daily as needed. 05/29/18   [provider]  Calcium Citrate-Vitamin D (CALCIUM + D PO) Take 1 tablet by mouth daily.    [provider]  carbidopa-levodopa (SINEMET IR) 25-100 MG tablet Take 1 tablet by mouth 3 (three) times daily. 11/08/21   Patel, Arvin Collard K, DO  cephALEXin (KEFLEX) 500 MG capsule Take 1 capsule (500 mg total) by mouth 4 (four) times daily. Patient not taking: Reported on 11/08/2021 01/13/21   Sherrell Puller, PA-C  cetirizine (ZYRTEC) 10 MG tablet Take 10 mg by mouth daily. 12/14/20   [provider]  citalopram (CELEXA) 40 MG tablet Take 40 mg by mouth daily. 40 mg Daily    [provider]  Ginkgo Biloba 40 MG TABS Take by mouth daily.    [provider]  Glycopyrrolate-Formoterol (BEVESPI AEROSPHERE IN) Inhale 1 puff into the lungs 2 (two) times daily.    [provider]  losartan (COZAAR) 25 MG tablet Take 25 mg by mouth daily. 04/18/21   [provider]  Multiple Vitamin (MULTIVITAMIN WITH MINERALS) TABS tablet Take 1 tablet by mouth daily.    [provider]  naproxen (NAPROSYN) 500  MG tablet Take 1 pill twice a day as needed for pain and numbness 06/04/20   Milton Ferguson, MD  omeprazole (PRILOSEC) 20 MG capsule Take 20 mg by mouth daily. 04/18/21   [provider]  SPIRIVA RESPIMAT 2.5 MCG/ACT AERS Inhale 2 sprays into the lungs daily. 05/29/18   [provider]      Allergies    Patient has no known allergies.    Review of Systems   Review of Systems  Gastrointestinal:  Negative for nausea and vomiting.  Skin:        Wrist and ankle pain  Neurological:  Positive for light-headedness and headaches.  All other systems reviewed and are negative.   Physical Exam Updated Vital Signs Ht 1.753 m (5\' 9" )   Wt 102.1 kg   BMI 33.23 kg/m  Physical Exam Vitals and nursing note reviewed.  Constitutional:      Appearance: He is well-developed. He is obese.     Comments: Chronically ill-appearing but nontoxic  HENT:     Head: Normocephalic and atraumatic.     Comments: No hematomas or abrasions of the scalp noted Eyes:     Pupils: Pupils are equal, round, and reactive to light.  Neck:     Comments: No midline C-spine tenderness palpation, step-off, deformity, he does have some bilateral paraspinous muscle tenderness to palpation Cardiovascular:  Rate and Rhythm: Normal rate and regular rhythm.     Heart sounds: Normal heart sounds. No murmur heard. Pulmonary:     Effort: Pulmonary effort is normal. No respiratory distress.     Breath sounds: Normal breath sounds. No wheezing.  Abdominal:     Palpations: Abdomen is soft.     Tenderness: There is no abdominal tenderness. There is no rebound.  Musculoskeletal:     Cervical back: Normal range of motion and neck supple.     Comments: Abrasion to the right wrist with tenderness to palpation, abrasion right medial ankle, no obvious deformities, 2+ DP pulse  Lymphadenopathy:     Cervical: No cervical adenopathy.  Skin:    General: Skin is warm and dry.  Neurological:     Mental Status: He is alert  and oriented to person, place, and time.     Comments: Slight tremor noted  Psychiatric:        Mood and Affect: Mood normal.     ED Results / Procedures / Treatments   Labs (all labs ordered are listed, but only abnormal results are displayed) Labs Reviewed - No data to display  EKG None  Radiology DG Ankle Complete Right  Result Date: 03/28/2022 CLINICAL DATA:  Fall, right ankle pain EXAM: RIGHT ANKLE - COMPLETE 3+ VIEW COMPARISON:  None Available. FINDINGS: No fracture or dislocation is seen. Mild osseous irregularity inferior to the distal fibula is well corticated and not acute, likely reflecting sequela of prior trauma. The ankle mortise is intact. The base of the fifth metatarsal is unremarkable. Visualized soft tissues are within normal limits. Small plantar calcaneal enthesophyte. IMPRESSION: Negative. Electronically Signed   By: Julian Hy M.D.   On: 03/28/2022 00:50   DG Wrist Complete Right  Result Date: 03/28/2022 CLINICAL DATA:  Fall, right wrist pain EXAM: RIGHT WRIST - COMPLETE 3+ VIEW COMPARISON:  None Available. FINDINGS: No fracture or dislocation is seen. The joint spaces are preserved. Visualized soft tissues are within normal limits. IMPRESSION: Negative. Electronically Signed   By: Julian Hy M.D.   On: 03/28/2022 00:49   CT Head Wo Contrast  Result Date: 03/28/2022 CLINICAL DATA:  Fall, head trauma EXAM: CT HEAD WITHOUT CONTRAST TECHNIQUE: Contiguous axial images were obtained from the base of the skull through the vertex without intravenous contrast. RADIATION DOSE REDUCTION: This exam was performed according to the departmental dose-optimization program which includes automated exposure control, adjustment of the mA and/or kV according to patient size and/or use of iterative reconstruction technique. COMPARISON:  MRI brain dated 12/29/2020 FINDINGS: Brain: No evidence of acute infarction, hemorrhage, hydrocephalus, extra-axial collection or mass  lesion/mass effect. Mild subcortical white matter and periventricular small vessel ischemic changes. Vascular: No hyperdense vessel or unexpected calcification. Skull: Normal. Negative for fracture or focal lesion. Sinuses/Orbits: The visualized paranasal sinuses are essentially clear. Partial opacification of the right mastoid air cells. Other: None. IMPRESSION: No evidence of acute intracranial abnormality. Mild small vessel ischemic changes. Electronically Signed   By: Julian Hy M.D.   On: 03/28/2022 00:25    Procedures Procedures    Medications Ordered in ED Medications - No data to display  ED Course/ Medical Decision Making/ A&P                             Medical Decision Making Amount and/or Complexity of Data Reviewed Radiology: ordered.   This patient presents to the ED for concern of  fall, this involves an extensive number of treatment options, and is a complaint that carries with it a high risk of complications and morbidity.  I considered the following differential and admission for this acute, potentially life threatening condition.  The differential diagnosis includes traumatic injury such as head bleed, fracture, concussion  MDM:    This is a 57 year old male who presents following a fall.  He is nontoxic and vital signs are reassuring.  He has a history of Parkinson's.  Fall was mechanical.  No external signs of trauma to the head.  He has some abrasions to the right wrist and right ankle.  CT head obtained and shows no evidence of intracranial bleed.  Low suspicion for cervical spine fracture.  X-rays of the right wrist and right ankle do not reveal any fractures.    (Labs, imaging, consults)  Labs: I Ordered, and personally interpreted labs.  The pertinent results include: None  Imaging Studies ordered: I ordered imaging studies including CT head, x-ray wrist and ankle I independently visualized and interpreted imaging. I agree with the radiologist  interpretation  Additional history obtained from family at bedside.  External records from outside source obtained and reviewed including prior evaluations  Cardiac Monitoring: The patient was maintained on a cardiac monitor.  I personally viewed and interpreted the cardiac monitored which showed an underlying rhythm of: Sinus rhythm  Reevaluation: After the interventions noted above, I reevaluated the patient and found that they have :stayed the same  Social Determinants of Health:  lives independently  Disposition: Discharge  Co morbidities that complicate the patient evaluation  Past Medical History:  Diagnosis Date   Anxiety    COPD (chronic obstructive pulmonary disease) ([place])    Depression    Gastric ulcer    Hypercholesterolemia    Parkinsons      Medicines No orders of the defined types were placed in this encounter.   I have reviewed the patients home medicines and have made adjustments as needed  Problem List / ED Course: Problem List Items Addressed This Visit   None Visit Diagnoses     Minor head injury, initial encounter    -  Primary   Abrasion of right wrist, initial encounter       Abrasion, right ankle, initial encounter                       Final Clinical Impression(s) / ED Diagnoses Final diagnoses:  Minor head injury, initial encounter  Abrasion of right wrist, initial encounter  Abrasion, right ankle, initial encounter    Rx / DC Orders ED Discharge Orders     None         Merryl Hacker, MD 03/28/22 [phone]

## 2022-04-26 ENCOUNTER — Encounter: Payer: Self-pay | Admitting: Radiology

## 2022-05-14 ENCOUNTER — Ambulatory Visit: Payer: Medicaid Other | Admitting: Neurology

## 2022-05-22 ENCOUNTER — Ambulatory Visit: Payer: Medicaid Other | Admitting: Neurology

## 2022-06-12 ENCOUNTER — Ambulatory Visit (INDEPENDENT_AMBULATORY_CARE_PROVIDER_SITE_OTHER): Payer: Medicaid Other | Admitting: Neurology

## 2022-06-12 ENCOUNTER — Encounter: Payer: Self-pay | Admitting: Neurology

## 2022-06-12 DIAGNOSIS — G231 Progressive supranuclear ophthalmoplegia [Steele-Richardson-Olszewski]: Secondary | ICD-10-CM

## 2022-06-12 MED ORDER — CARBIDOPA-LEVODOPA 25-100 MG PO TABS: 1.0000 | ORAL_TABLET | Freq: Three times a day (TID) | ORAL | 3 refills | Status: DC

## 2022-06-12 NOTE — Progress Notes (Signed)
Follow-up Visit   Date: 06/12/22   Hector Davies MRN: [id number] DOB: 01-01-1966   Interim History: Hector Davies is a 58 y.o. left-handed Caucasian male with hyperlipidemia, depression, COPD, anxiety, and tobacco abuse returning to the clinic for follow-up of progressive nuclear palsy.  The patient was accompanied to the clinic by self.  History of present illness: Starting around the spring of 2022, he began having leg weakness, which he describes more as imbalance.  He looses his balance all the time tends to fall backwards and cannot always break his falls.  He says that he falls daily.  He walks unassisted.  He complains of low back pain. No numbness/tingling of the legs.  He lives at home with three daughters.    He smokes 2PPD x 45 years, previously smoking 4 PPD. He has remote history of drinking 6-12 pack per day x 10 years, stopped this 10 years ago.  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    UPDATE 04/03/2021:  Since his last visit, he has had extensive neurological evaluation including MRI neuroaxis, which has been notable for moderate lumbar canal stenosis at L3-4 and L3-5.  MRI brain, cervical and thoracic spine was unremarkable.  Neurosurgery was consulted and did not feel that symptoms were severe enough to be caused by his lumbar canal stenosis.  He went to physical therapy and did not feel that it helped, so stopped.  He continues to fall frequently, sometimes multiple times per day, other days, it may be several times per week.  He tends to always fall backwards and is unable to break this fall. Daughter states that his gait has become more shuffling and any slight unsteadiness which cause his to fall backwards. Patient also reports feeling lack of coordination and stiffness.   UPDATE 06/07/2021:  He is here for follow-up visit.  At his last visit, I discussed the diagnosis of parkinson's disease based on his  DAT scan.  There has been no significant change in his symptoms since starting sinemet. He is using a Insurance underwriter at home but it tends run into things.  He continues to fall about twice daily, usually in the morning.  He requires assistance to stand up.  No problems with speech or swallow.  He has filed for social security disability and scheduled for medical evaluation in May.    UPDATE 11/08/2021:  He is here for follow-up visit.  He has been having increased difficulty with swallowing solids and liquids.  Daughter says he has several choking spells. He continues to fall daily and has superficial injuries.  He tries to stay active and continues to mow the grass, even though family has asked him not to. He will be getting a smaller powerchair because the current one is too large for his home.  His disability was denied, however, it did not include my notes regarding his parkinson's disease.  He has appealed it and is in the process of waiting for this.  He does disclose having financial hardship as he is unable to work.   UPDATE 06/12/2022:  He reports ongoing issues with falling backwards and tends to fall about twice daily.  Fortunately, he has not injured himself. He is only able to use powerchair at home because he does not have a ramp.  He still has choking spells and was unable to go for his swallow study the last time.  He is struggling  financially because his disability is not approved and he is unable to work.  He has not been able to pick up his medications, because he cannot afford $4 copay.  He is trying to find work, but unable to find something which allows him to sit.  Unfortunately, he is not computer savvy so cannot work from home. He tells me that he needs to move out of his current home because of plumbing issues and landlord has given him 6 weeks.  He is not sure whether he and his daughters will be moving.    Medications:  Current Outpatient Medications on File Prior to Visit   Medication Sig Dispense Refill   albuterol (VENTOLIN HFA) 108 (90 Base) MCG/ACT inhaler Inhale 2 puffs into the lungs 4 (four) times daily as needed.     Calcium Citrate-Vitamin D (CALCIUM + D PO) Take 1 tablet by mouth daily.     cetirizine (ZYRTEC) 10 MG tablet Take 10 mg by mouth daily.     citalopram (CELEXA) 40 MG tablet Take 40 mg by mouth daily. 40 mg Daily  Haven't taken in 2 weeks.     Ginkgo Biloba 40 MG TABS Take by mouth daily.     Glycopyrrolate-Formoterol (BEVESPI AEROSPHERE IN) Inhale 1 puff into the lungs 2 (two) times daily.     losartan (COZAAR) 25 MG tablet Take 25 mg by mouth daily.     Multiple Vitamin (MULTIVITAMIN WITH MINERALS) TABS tablet Take 1 tablet by mouth daily.     rosuvastatin (CRESTOR) 40 MG tablet SMARTSIG:1 Tablet(s) By Mouth Every Evening     SPIRIVA RESPIMAT 2.5 MCG/ACT AERS Inhale 2 sprays into the lungs daily.     cephALEXin (KEFLEX) 500 MG capsule Take 1 capsule (500 mg total) by mouth 4 (four) times daily. (Patient not taking: Reported on 11/08/2021) 28 capsule 0   No current facility-administered medications on file prior to visit.    Allergies: No Known Allergies  Vital Signs:  BP 132/72   Pulse 71   Ht 5\' 9"  (1.753 m)   Wt 222 lb (100.7 kg)   SpO2 99%   BMI 32.78 kg/m   Neurological Exam: MENTAL STATUS including orientation to time, place, person, recent and remote memory, attention span and concentration, language, and fund of knowledge is normal.  Speech is soft, not dysarthric.  CRANIAL NERVES:   Normal conjugate, extra-ocular eye movements in all directions of gaze, except restricted upgaze bilaterally.  No ptosis .  Face is symmetric.   MOTOR:  Motor strength is 5/5 in all extremities. There is mild rigidity in the left arm and leg.  No atrophy or fasciculations.  Trace hand tremor when hands outstretched.      COORDINATION/GAIT:  Normal finger-to- nose-finger.  Mild decrement in amplitude with finger tapping. Gait appears wide  based, small shuffling steps, appears stiff and ataxic. Unassisted today.   Data: MRI cervical spine wo contrast 02/03/2021: 1. Motion degraded examination. 2. Multilevel cervical disc degeneration with mild spinal stenosis and moderate to severe neural foraminal stenosis at C5-6 and C6-7.  MRI thoracic spine wo contrast 02/03/2021: Mild thoracic spondylosis without stenosis.  MRI brain wo contrast 12/29/2020: 1. Mildly motion degraded exam with no acute intracranial abnormality.   2. Bilateral mastoid effusions with possible right middle ear opacification. Such mastoid fluid is most commonly  postinflammatory. But consider right side otitis media or cholesteatoma.  MRI lumbar spine wo contrast 12/29/2020: IMPRESSION: 1. 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 Moderate spinal stenosis at L3-L4 and L4-L5, mild at L1-L2 and L2-L3.   2. Disc degeneration most pronounced at L4-L5. Mild bilateral L3 and L4 neural foraminal stenosis.    DAT scan 04/27/2021: Significant decreased striatal Ioflupane activity as above. This pattern can be seen in Parkinsonian syndromes.   Of note, DaTSCAN is not diagnostic of Parkinsonian syndromes, which remains a clinical diagnosis. DaTscan is an adjuvant test to aid in the clinical diagnosis of Parkinsonian syndromes  IMPRESSION/PLAN: Atypical parkinson syndrome, most consistent with progressive supranuclear palsy.  Clinically, he has vertical gaze paresis, fenestrating gait, rigidity, bradykinesia, and frequent falls backwards. DAT scan is positive.      PLAN/RECOMMENDATIONS:  Continue sinemet 25/100 1 tablet three times daily He is very high risk of falling. Strongly advised patient to avoid walking and take extra precautions with positional changes and uneven ground. He has a power wheelchair at home.  Also, I recommend that he try to find a first floor apartment which is  handicap accessible.  Modified barium swallow when patient would like to pursue.  Recommend chin tuck when swallowing Parkinson's disease resources provided.  I will also reach out to Lynwood Dawley, Movement Disorder LCSW to see if there are any resources available to him assist with affording his medications.  Return to clinic in 6 months  Total time spent reviewing records, interview, history/exam, documentation, and coordination of care on day of encounter:  30 min    Thank you for allowing me to participate in patient's care.  If I can answer any additional questions, I would be pleased to do so.    Sincerely,    Renny Gunnarson K. Allena Katz, DO

## 2022-06-25 ENCOUNTER — Telehealth: Payer: Self-pay | Admitting: Neurology

## 2022-06-25 NOTE — Telephone Encounter (Signed)
New message   Patient trying to get in touch with the social worker, asking for a call back regarding medication

## 2022-06-26 NOTE — Telephone Encounter (Signed)
I provided her information and thought that I messaged her as well, but will resend a note to Maralyn Sago.

## 2022-07-27 ENCOUNTER — Telehealth: Payer: Self-pay | Admitting: Clinical

## 2022-07-27 NOTE — Telephone Encounter (Signed)
I spoke with pt daughter to provide the following information, encouraged her or Hector Davies to connect with questions--    I am Hector Dawley, LCSW and I work with Dr. Allena Katz. Dr. Allena Katz asked me to connect with you to share information   There is a program called [facility] that provides many commonly prescribed and over the counter medications for no-cost to patients, here is the website and application- [employer]arianPizzas.si   There are also individual patient assistance programs for some medications, if a med isn't covered under [facility] there may be an assistance program for it, please let me know if this is the case.    PSP is listed under Social Security's Compassionate Allowance List, this means the disability claim can be fast tracked to approval. Please connect with Social Security and share that you have PSP and ask them to review the compassionate allowance list.    Regarding work, [facility] may be able to assist, they also have other supports too. [email or web address]      Here is our Consulting civil engineer Rehab office [phone]                        [address] [place] [phone]   I lead an educational support group for patients with PSP and similar conditions. I invite you to attend.   Please let me know what questions you have for me, and how else I may be able to assist you.   Hector Dawley, LCSW

## 2022-08-23 ENCOUNTER — Encounter: Payer: Self-pay | Admitting: *Deleted

## 2022-12-17 ENCOUNTER — Ambulatory Visit: Payer: MEDICAID | Admitting: Neurology

## 2023-01-03 ENCOUNTER — Telehealth: Payer: Self-pay | Admitting: Internal Medicine

## 2023-01-03 NOTE — Telephone Encounter (Signed)
Patient brought by his colonoscopy questionnaire but I told him it is showing his insurance has elapsed.  He didn't understand what that meant so I explained to him.  He said that he was supposed to be receiving a new insurance card but didn't have it yet.  I advised him to bring that in so it could be scanned into his chart and he said he would bring by once he received it.  He is saying he still has [employer]

## 2023-01-08 ENCOUNTER — Telehealth: Payer: Self-pay | Admitting: *Deleted

## 2023-01-08 NOTE — Telephone Encounter (Signed)
  Procedure: colonoscopy  Height: 5'9" Weight: 222 lb       Have you had a colonoscopy before?  no  Do you have family history of colon cancer?  no  Do you have a family history of polyps? no  Previous colonoscopy with polyps removed? no  Do you have a history colorectal cancer?   no  Are you diabetic?  no  Do you have a prosthetic or mechanical heart valve? no  Do you have a pacemaker/defibrillator?   No   Have you had endocarditis/atrial fibrillation?  no  Do you use supplemental oxygen/CPAP?  no  Have you had joint replacement within the last 12 months?  no  Do you tend to be constipated or have to use laxatives?  no   Do you have history of alcohol use? If yes, how much and how often.  yes  Do you have history or are you using drugs? If yes, what do are you  using?  no  Have you ever had a stroke/heart attack?  no  Have you ever had a heart or other vascular stent placed,?no  Do you take weight loss medication? no  Do you take any blood-thinning medications such as: (Plavix, aspirin, Coumadin, Aggrenox, Brilinta, Xarelto, Eliquis, Pradaxa, Savaysa or Effient)? aspirin  If yes we need the name, milligram, dosage and who is prescribing doctor:  81 mg             Current Outpatient Medications  Medication Sig Dispense Refill   albuterol (VENTOLIN HFA) 108 (90 Base) MCG/ACT inhaler Inhale 2 puffs into the lungs 4 (four) times daily as needed.     aspirin EC 81 MG tablet Take 81 mg by mouth daily. Swallow whole.     carbidopa-levodopa (SINEMET IR) 25-100 MG tablet Take 1 tablet by mouth 3 (three) times daily. 270 tablet 3   citalopram (CELEXA) 40 MG tablet Take 40 mg by mouth daily. 40 mg Daily  Haven't taken in 2 weeks.     metFORMIN (GLUCOPHAGE) 500 MG tablet Take 250 mg by mouth daily.     rosuvastatin (CRESTOR) 40 MG tablet SMARTSIG:1 Tablet(s) By Mouth Every Evening     tamsulosin (FLOMAX) 0.4 MG CAPS capsule Take 0.8 mg by mouth daily.     No current  facility-administered medications for this visit.    No Known Allergies

## 2023-01-29 ENCOUNTER — Ambulatory Visit: Payer: MEDICAID | Admitting: Neurology

## 2023-01-29 ENCOUNTER — Encounter: Payer: Self-pay | Admitting: Neurology

## 2023-01-29 DIAGNOSIS — G231 Progressive supranuclear ophthalmoplegia [Steele-Richardson-Olszewski]: Secondary | ICD-10-CM

## 2023-01-29 MED ORDER — CARBIDOPA-LEVODOPA 25-100 MG PO TABS: 1.5000 | ORAL_TABLET | Freq: Three times a day (TID) | ORAL | 3 refills | Status: DC

## 2023-01-29 NOTE — Progress Notes (Signed)
Follow-up Visit   Date: 01/29/23   Hector Davies MRN: [id number] DOB: [birth year]   Interim History: Hector Davies is a 57 y.o. left-handed Caucasian male with hyperlipidemia, depression, COPD, anxiety, and tobacco abuse returning to the clinic for follow-up of progressive nuclear palsy.  The patient was accompanied to the clinic by self.  History of present illness: Starting around the spring of 2022, he began having leg weakness, which he describes more as imbalance.  He looses his balance all the time tends to fall backwards and cannot always break his falls.  He says that he falls daily.     He smokes 2PPD x 45 years, previously smoking 4 PPD. He has remote history of drinking 6-12 pack per day x 10 years, stopped this 10 years ago.  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    He had extensive neurological evaluation including MRI neuroaxis, which has been notable for moderate lumbar canal stenosis at L3-4 and L3-5.  MRI brain, cervical and thoracic spine was unremarkable.  Neurosurgery was consulted and did not feel that symptoms were severe enough to be caused by his lumbar canal stenosis. He underwent DAT scan in 2023 which was positive and diagnosed with PSP.  He was using a powerchair but it was hard to maneuver in his home.   UPDATE 06/12/2022:  He reports ongoing issues with falling backwards and tends to fall about twice daily.  Fortunately, he has not injured himself. He is only able to use powerchair at home because he does not have a ramp.  He still has choking spells and was unable to go for his swallow study the last time.  He is struggling financially because his disability is not approved and he is unable to work.  He has not been able to pick up his medications, because he cannot afford $4 copay.  He is trying to find work, but unable to find something which allows him to sit.  Unfortunately, he is not  computer savvy so cannot work from home. He tells me that he needs to move out of his current home because of plumbing issues and landlord has given him 6 weeks.  He is not sure whether he and his daughters will be moving.    UPDATE 01/29/2023:  He is here for follow-up visit.  He was approved for SSI disability in July.  He moved into a mobile home in September with his son and two daughters.  He is unable to use a power chair in his current home, so manages with a cane.  He continues to fall backwards multiple times a day.  Fortunately, he has not suffered any injuries.    Medications:  Current Outpatient Medications on File Prior to Visit  Medication Sig Dispense Refill   albuterol (VENTOLIN HFA) 108 (90 Base) MCG/ACT inhaler Inhale 2 puffs into the lungs 4 (four) times daily as needed.     aspirin EC 81 MG tablet Take 81 mg by mouth daily. Swallow whole.     carbidopa-levodopa (SINEMET IR) 25-100 MG tablet Take 1 tablet by mouth 3 (three) times daily. 270 tablet 3   citalopram (CELEXA) 40 MG tablet Take 40 mg by mouth daily. 40 mg Daily  Haven't taken in 2 weeks.     metFORMIN (GLUCOPHAGE) 500 MG tablet Take 250 mg by mouth daily.     Multiple Vitamins-Minerals (MULTIVITAMIN WITH MINERALS) tablet Take 1  tablet by mouth daily.     rosuvastatin (CRESTOR) 40 MG tablet SMARTSIG:1 Tablet(s) By Mouth Every Evening     SPIRIVA HANDIHALER 18 MCG inhalation capsule Place 18 mcg into inhaler and inhale daily.     tamsulosin (FLOMAX) 0.4 MG CAPS capsule Take 0.8 mg by mouth daily.     No current facility-administered medications on file prior to visit.    Allergies: No Known Allergies  Vital Signs:  BP 130/72   Pulse 88   Ht 5\' 9"  (1.753 m)   Wt 226 lb (102.5 kg)   SpO2 97%   BMI 33.37 kg/m   Neurological Exam: MENTAL STATUS including orientation to time, place, person, recent and remote memory, attention span and concentration, language, and fund of knowledge is normal.  Speech is soft,  not dysarthric.  CRANIAL NERVES:   Normal conjugate, extra-ocular eye movements in all directions of gaze, except restricted upgaze bilaterally.  No ptosis .  Face is symmetric.   MOTOR:  Motor strength is 5/5 in all extremities. There is mild-moderate rigidity in the bilateral arms and legs.  No atrophy or fasciculations.  Trace hand tremor when hands outstretched.      COORDINATION/GAIT:  Normal finger-to- nose-finger.  Mild decrement in amplitude with finger tapping. Gait appears wide based, small shuffling steps, appears stiff and ataxic, assisted with cane.   Data: MRI cervical spine wo contrast 02/03/2021: 1. Motion degraded examination. 2. Multilevel cervical disc degeneration with mild spinal stenosis and moderate to severe neural foraminal stenosis at C5-6 and C6-7.  MRI thoracic spine wo contrast 02/03/2021: Mild thoracic spondylosis without stenosis.  MRI brain wo contrast 12/29/2020: 1. Mildly motion degraded exam with no acute intracranial abnormality.   2. Bilateral mastoid effusions with possible right middle ear opacification. Such mastoid fluid is most commonly  postinflammatory. But consider right side otitis media or cholesteatoma.  MRI lumbar spine wo contrast 12/29/2020: IMPRESSION: 1. 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 Moderate spinal stenosis at L3-L4 and L4-L5, mild at L1-L2 and L2-L3.   2. Disc degeneration most pronounced at L4-L5. Mild bilateral L3 and L4 neural foraminal stenosis.    DAT scan 04/27/2021: Significant decreased striatal Ioflupane activity as above. This pattern can be seen in Parkinsonian syndromes.   Of note, DaTSCAN is not diagnostic of Parkinsonian syndromes, which remains a clinical diagnosis. DaTscan is an adjuvant test to aid in the clinical diagnosis of Parkinsonian syndromes  IMPRESSION/PLAN: Atypical parkinson syndrome, most consistent with  progressive supranuclear palsy.  Clinically, he has vertical gaze paresis, fenestrating gait, rigidity, bradykinesia, and frequent falls backwards. DAT scan is positive.      PLAN/RECOMMENDATIONS:  Increase sinemet 25/100 1.5 tablet three times daily He remains very high risk of fall.  I urged him to use a walker.  Return to clinic in 6 months  Total time spent reviewing records, interview, history/exam, documentation, and coordination of care on day of encounter:  30 min    Thank you for allowing me to participate in patient's care.  If I can answer any additional questions, I would be pleased to do so.    Sincerely,    Decari Duggar K. Allena Katz, DO

## 2023-01-29 NOTE — Patient Instructions (Signed)
Increase sinemet to 1.5 tablet three times daily.

## 2023-02-05 NOTE — Telephone Encounter (Signed)
Ok to schedule. ASA 2.   Day of prep: metformin ok Am of tcs: hold metformin

## 2023-02-06 MED ORDER — PEG 3350-KCL-NA BICARB-NACL 420 G PO SOLR: 4000.0000 mL | Freq: Once | ORAL | 0 refills | Status: AC

## 2023-02-06 NOTE — Addendum Note (Signed)
Addended by: Armstead Peaks on: 02/06/2023 02:40 PM   Modules accepted: Orders

## 2023-02-06 NOTE — Telephone Encounter (Signed)
Spoke with pt. Scheduled with Dr. Marletta Lor 1/24 at 11:30am. Aware will send instructions to him. Will also send rx for prep to [facility] in [place].

## 2023-02-07 ENCOUNTER — Encounter: Payer: Self-pay | Admitting: *Deleted

## 2023-02-07 NOTE — Telephone Encounter (Signed)
Referral completed, TCS apt letter sent to PCP

## 2023-03-22 ENCOUNTER — Ambulatory Visit (HOSPITAL_COMMUNITY): Payer: MEDICAID | Admitting: Anesthesiology

## 2023-03-22 ENCOUNTER — Other Ambulatory Visit: Payer: Self-pay

## 2023-03-22 ENCOUNTER — Ambulatory Visit (HOSPITAL_BASED_OUTPATIENT_CLINIC_OR_DEPARTMENT_OTHER): Payer: MEDICAID | Admitting: Anesthesiology

## 2023-03-22 ENCOUNTER — Encounter (HOSPITAL_COMMUNITY)
Admission: RE | Disposition: A | Discharge status: Home / Self Care | Payer: Self-pay | Source: Ambulatory Visit | Attending: Internal Medicine

## 2023-03-22 ENCOUNTER — Ambulatory Visit (HOSPITAL_COMMUNITY)
Admission: RE | Admit: 2023-03-22 | Discharge: 2023-03-22 | Disposition: A | Discharge status: Home / Self Care | Payer: MEDICAID | Source: Ambulatory Visit | Attending: Internal Medicine | Admitting: Internal Medicine

## 2023-03-22 ENCOUNTER — Telehealth: Payer: Self-pay | Admitting: *Deleted

## 2023-03-22 ENCOUNTER — Encounter (HOSPITAL_COMMUNITY): Payer: Self-pay | Admitting: Internal Medicine

## 2023-03-22 DIAGNOSIS — Z1211 Encounter for screening for malignant neoplasm of colon: Secondary | ICD-10-CM

## 2023-03-22 DIAGNOSIS — K648 Other hemorrhoids: Secondary | ICD-10-CM | POA: Diagnosis not present

## 2023-03-22 DIAGNOSIS — I10 Essential (primary) hypertension: Secondary | ICD-10-CM | POA: Diagnosis not present

## 2023-03-22 DIAGNOSIS — K635 Polyp of colon: Secondary | ICD-10-CM | POA: Diagnosis not present

## 2023-03-22 DIAGNOSIS — F32A Depression, unspecified: Secondary | ICD-10-CM | POA: Diagnosis not present

## 2023-03-22 DIAGNOSIS — J449 Chronic obstructive pulmonary disease, unspecified: Secondary | ICD-10-CM | POA: Diagnosis not present

## 2023-03-22 DIAGNOSIS — F419 Anxiety disorder, unspecified: Secondary | ICD-10-CM | POA: Diagnosis not present

## 2023-03-22 HISTORY — PX: COLONOSCOPY WITH PROPOFOL: SHX5780

## 2023-03-22 HISTORY — PX: POLYPECTOMY: SHX5525

## 2023-03-22 LAB — GLUCOSE, CAPILLARY: Glucose-Capillary: 105 mg/dL — ABNORMAL HIGH (ref 70–99)

## 2023-03-22 SURGERY — COLONOSCOPY WITH PROPOFOL
Anesthesia: General

## 2023-03-22 MED ORDER — PROPOFOL 10 MG/ML IV BOLUS
INTRAVENOUS | Status: DC | PRN
  Administered 2023-03-22: 130 mg via INTRAVENOUS

## 2023-03-22 MED ORDER — PROPOFOL 500 MG/50ML IV EMUL
INTRAVENOUS | Status: DC | PRN
Start: 2023-03-22 — End: 2023-03-22
  Administered 2023-03-22: 100 ug/kg/min via INTRAVENOUS

## 2023-03-22 MED ORDER — LACTATED RINGERS IV SOLN: INTRAVENOUS | Status: DC

## 2023-03-22 NOTE — Transfer of Care (Signed)
Immediate Anesthesia Transfer of Care Note  Patient: Hector Davies  Procedure(s) Performed: COLONOSCOPY WITH PROPOFOL POLYPECTOMY  Patient Location: Endoscopy Unit  Anesthesia Type:General  Level of Consciousness: drowsy and patient cooperative  Airway & Oxygen Therapy: Patient Spontanous Breathing  Post-op Assessment: Report given to RN and Post -op Vital signs reviewed and stable  Post vital signs: Reviewed and stable  Last Vitals:  Vitals Value Taken Time  BP 108/54 03/22/23 1119  Temp 37 C 03/22/23 1119  Pulse 67 03/22/23 1119  Resp 23 03/22/23 1119  SpO2 96 % 03/22/23 1119    Last Pain:  Vitals:   03/22/23 1119  TempSrc: Oral  PainSc: 0-No pain      Patients Stated Pain Goal: 8 (03/22/23 1038)  Complications: No notable events documented.

## 2023-03-22 NOTE — Anesthesia Postprocedure Evaluation (Signed)
Anesthesia Post Note  Patient: Hector Davies  Procedure(s) Performed: COLONOSCOPY WITH PROPOFOL POLYPECTOMY  Patient location during evaluation: Phase II Anesthesia Type: General Level of consciousness: awake Pain management: pain level controlled Vital Signs Assessment: post-procedure vital signs reviewed and stable Respiratory status: spontaneous breathing and respiratory function stable Cardiovascular status: blood pressure returned to baseline and stable Postop Assessment: no headache and no apparent nausea or vomiting Anesthetic complications: no Comments: Late entry   No notable events documented.   Last Vitals:  Vitals:   03/22/23 1038 03/22/23 1119  BP: (!) 149/83 (!) 108/54  Pulse: 63 67  Resp: 14 (!) 23  Temp: 36.5 C 37 C  SpO2: 95% 96%    Last Pain:  Vitals:   03/22/23 1119  TempSrc: Oral  PainSc: 0-No pain                 Windell Norfolk

## 2023-03-22 NOTE — Telephone Encounter (Signed)
Pt left vm asking some questions about his procedure today 03/22/23.  Attempted to call pt, unable to leave messge due to  vm is not set up

## 2023-03-22 NOTE — Op Note (Signed)
[facility] Patient Name: Hector Davies Procedure Date: 03/22/2023 10:29 AM MRN: [id number] Date of Birth: Mar 15, 1965 Attending MD: Hennie Duos. Marletta Lor , [place], [id number] CSN: [id number] Age: 58 Admit Type: Outpatient Procedure:                Colonoscopy Indications:              Screening for colorectal malignant neoplasm Providers:                Hennie Duos. Marletta Lor, DO, Jannett Celestine, RN, Kristine L.                            Jessee Avers, Technician Referring MD:              Medicines:                See the Anesthesia note for documentation of the                            administered medications Complications:            No immediate complications. Estimated Blood Loss:     Estimated blood loss was minimal. Procedure:                Pre-Anesthesia Assessment:                           - The anesthesia plan was to use monitored                            anesthesia care (MAC).                           After obtaining informed consent, the colonoscope                            was passed under direct vision. Throughout the                            procedure, the patient's blood pressure, pulse, and                            oxygen saturations were monitored continuously. The                            [id number]-[id number] ([id number]) scope was introduced through                            the anus and advanced to the the cecum, identified                            by appendiceal orifice and ileocecal valve. The                            colonoscopy was performed without difficulty. The                            patient tolerated the  procedure well. The quality                            of the bowel preparation was evaluated using the                            BBPS [facility] Bowel Preparation Scale) with scores                            of: Right Colon = 2 (minor amount of residual                            staining, small fragments of stool and/or opaque                             liquid, but mucosa seen well), Transverse Colon = 2                            (minor amount of residual staining, small fragments                            of stool and/or opaque liquid, but mucosa seen                            well) and Left Colon = 2 (minor amount of residual                            staining, small fragments of stool and/or opaque                            liquid, but mucosa seen well). The total BBPS score                            equals 6. The quality of the bowel preparation was                            good. Scope In: 10:58:53 AM Scope Out: 11:15:05 AM Scope Withdrawal Time: 0 hours 13 minutes 55 seconds  Total Procedure Duration: 0 hours 16 minutes 12 seconds  Findings:      Non-bleeding internal hemorrhoids were found during endoscopy.      A 6 mm polyp was found in the sigmoid colon. The polyp was sessile. The       polyp was removed with a cold snare. Resection and retrieval were       complete.      The exam was otherwise without abnormality. Impression:               - Non-bleeding internal hemorrhoids.                           - One 6 mm polyp in the sigmoid colon, removed with                            a  cold snare. Resected and retrieved.                           - The examination was otherwise normal. Moderate Sedation:      Per Anesthesia Care Recommendation:           - Patient has a contact number available for                            emergencies. The signs and symptoms of potential                            delayed complications were discussed with the                            patient. Return to normal activities tomorrow.                            Written discharge instructions were provided to the                            patient.                           - Resume previous diet.                           - Continue present medications.                           - Await pathology results.                           -  Repeat colonoscopy in 7-10 years for surveillance.                           - Return to GI clinic PRN. Procedure Code(s):        --- Professional ---                           [phone], Colonoscopy, flexible; with removal of                            tumor(s), polyp(s), or other lesion(s) by snare                            technique Diagnosis Code(s):        --- Professional ---                           Z12.11, Encounter for screening for malignant                            neoplasm of colon                           D12.5, Benign neoplasm of sigmoid colon  K64.8, Other hemorrhoids CPT copyright 2022 American Medical Association. All rights reserved. The codes documented in this report are preliminary and upon coder review may  be revised to meet current compliance requirements. Hennie Duos. Marletta Lor, DO Hennie Duos. Marletta Lor, DO 03/22/2023 11:19:16 AM This report has been signed electronically. Number of Addenda: 0

## 2023-03-22 NOTE — Discharge Instructions (Addendum)
  Colonoscopy Discharge Instructions  Read the instructions outlined below and refer to this sheet in the next few weeks. These discharge instructions provide you with general information on caring for yourself after you leave the hospital. Your doctor may also give you specific instructions. While your treatment has been planned according to the most current medical practices available, unavoidable complications occasionally occur.   ACTIVITY You may resume your regular activity, but move at a slower pace for the next 24 hours.  Take frequent rest periods for the next 24 hours.  Walking will help get rid of the air and reduce the bloated feeling in your belly (abdomen).  No driving for 24 hours (because of the medicine (anesthesia) used during the test).   Do not sign any important legal documents or operate any machinery for 24 hours (because of the anesthesia used during the test).  NUTRITION Drink plenty of fluids.  You may resume your normal diet as instructed by your doctor.  Begin with a light meal and progress to your normal diet. Heavy or fried foods are harder to digest and may make you feel sick to your stomach (nauseated).  Avoid alcoholic beverages for 24 hours or as instructed.  MEDICATIONS You may resume your normal medications unless your doctor tells you otherwise.  WHAT YOU CAN EXPECT TODAY Some feelings of bloating in the abdomen.  Passage of more gas than usual.  Spotting of blood in your stool or on the toilet paper.  IF YOU HAD POLYPS REMOVED DURING THE COLONOSCOPY: No aspirin products for 7 days or as instructed.  No alcohol for 7 days or as instructed.  Eat a soft diet for the next 24 hours.  FINDING OUT THE RESULTS OF YOUR TEST Not all test results are available during your visit. If your test results are not back during the visit, make an appointment with your caregiver to find out the results. Do not assume everything is normal if you have not heard from your  caregiver or the medical facility. It is important for you to follow up on all of your test results.  SEEK IMMEDIATE MEDICAL ATTENTION IF: You have more than a spotting of blood in your stool.  Your belly is swollen (abdominal distention).  You are nauseated or vomiting.  You have a temperature over 101.  You have abdominal pain or discomfort that is severe or gets worse throughout the day.   Your colonoscopy revealed 1 polyp(s) which I removed successfully. Await pathology results, my office will contact you. I recommend repeating colonoscopy in 7-10 years for surveillance purposes.   Otherwise follow up with GI as needed.  I hope you have a great rest of your week!  Hennie Duos. Marletta Lor, D.O. Gastroenterology and Hepatology [facility]

## 2023-03-22 NOTE — Anesthesia Preprocedure Evaluation (Signed)
Anesthesia Evaluation  Patient identified by MRN, date of birth, ID band Patient awake    Reviewed: Allergy & Precautions, H&P , NPO status , Patient's Chart, lab work & pertinent test results, reviewed documented beta blocker date and time   Airway Mallampati: II  TM Distance: >3 FB Neck ROM: full    Dental no notable dental hx.    Pulmonary neg pulmonary ROS, COPD, Patient abstained from smoking., former smoker   Pulmonary exam normal breath sounds clear to auscultation       Cardiovascular Exercise Tolerance: Good hypertension, negative cardio ROS  Rhythm:regular Rate:Normal     Neuro/Psych  PSYCHIATRIC DISORDERS Anxiety Depression    negative neurological ROS  negative psych ROS   GI/Hepatic negative GI ROS, Neg liver ROS, PUD,,,  Endo/Other  negative endocrine ROS    Renal/GU negative Renal ROS  negative genitourinary   Musculoskeletal   Abdominal   Peds  Hematology negative hematology ROS (+)   Anesthesia Other Findings   Reproductive/Obstetrics negative OB ROS                             Anesthesia Physical Anesthesia Plan  ASA: 2  Anesthesia Plan: General   Post-op Pain Management:    Induction:   PONV Risk Score and Plan: Propofol infusion  Airway Management Planned:   Additional Equipment:   Intra-op Plan:   Post-operative Plan:   Informed Consent: I have reviewed the patients History and Physical, chart, labs and discussed the procedure including the risks, benefits and alternatives for the proposed anesthesia with the patient or authorized representative who has indicated his/her understanding and acceptance.     Dental Advisory Given  Plan Discussed with: CRNA  Anesthesia Plan Comments:        Anesthesia Quick Evaluation

## 2023-03-22 NOTE — H&P (Signed)
Primary Care Physician:  Marylynn Pearson, FNP Primary Gastroenterologist:  Dr. Marletta Lor  Pre-Procedure History & Physical: HPI:  Hector Davies is a 58 y.o. male is here for first ever colonoscopy for colon cancer screening purposes.  Patient denies any family history of colorectal cancer.  No melena or hematochezia.  No abdominal pain or unintentional weight loss.  No change in bowel habits.  Overall feels well from a GI standpoint.  Past Medical History:  Diagnosis Date   Anxiety    COPD (chronic obstructive pulmonary disease) (HCC)    Depression    Gastric ulcer    Hypercholesterolemia    Parkinsons (HCC)     Past Surgical History:  Procedure Laterality Date   CATARACT EXTRACTION W/PHACO Right 11/13/2018   Procedure: CATARACT EXTRACTION PHACO AND INTRAOCULAR LENS PLACEMENT (IOC) RIGHT VISION BLUE;  Surgeon: Elliot Cousin, MD;  Location: [facility] OR[facility];  Service: Ophthalmology;  Laterality: Right;  [name]   01:18 CDE 12.46 Fluid pack lot # [id number]   CATARACT EXTRACTION W/PHACO Left 06/15/2021   Procedure: CATARACT EXTRACTION PHACO AND INTRAOCULAR LENS PLACEMENT (IOC) LEFT 6.77 00:50.2;  Surgeon: Estanislado Pandy, MD;  [facility]: [facility];  Service: Ophthalmology;  Laterality: Left;   NO PAST SURGERIES      Prior to Admission medications   Medication Sig Start Date End Date Taking? Authorizing Provider  albuterol (VENTOLIN HFA) 108 (90 Base) MCG/ACT inhaler Inhale 2 puffs into the lungs 4 (four) times daily as needed. 05/29/18   [provider]  aspirin EC 81 MG tablet Take 81 mg by mouth daily. Swallow whole.    [provider]  carbidopa-levodopa (SINEMET IR) 25-100 MG tablet Take 1.5 tablets by mouth 3 (three) times daily. 01/29/23   Patel, Roxana Hires K, DO  citalopram (CELEXA) 40 MG tablet Take 40 mg by mouth daily. 40 mg Daily  Haven't taken in 2 weeks.    [provider]  metFORMIN (GLUCOPHAGE) 500 MG tablet Take 250 mg by mouth daily. 12/11/22    [provider]  Multiple Vitamins-Minerals (MULTIVITAMIN WITH MINERALS) tablet Take 1 tablet by mouth daily.    [provider]  rosuvastatin (CRESTOR) 40 MG tablet SMARTSIG:1 Tablet(s) By Mouth Every Evening 04/14/22   [provider]  SPIRIVA HANDIHALER 18 MCG inhalation capsule Place 18 mcg into inhaler and inhale daily.    [provider]  tamsulosin (FLOMAX) 0.4 MG CAPS capsule Take 0.8 mg by mouth daily.    [provider]    Allergies as of 02/06/2023   (No Known Allergies)    Family History  Problem Relation Age of Onset   Uterine cancer Mother    Stroke Father     Social History   Socioeconomic History   Marital status: Legally Separated    Spouse name: Not on file   Number of children: 6   Years of education: Not on file   Highest education level: Not on file  Occupational History   Not on file  Tobacco Use   Smoking status: Every Day    Current packs/day: 2.00    Average packs/day: 2.0 packs/day for 42.0 years (84.0 ttl pk-yrs)    Types: Cigarettes   Smokeless tobacco: Never  Vaping Use   Vaping status: Never Used  Substance and Sexual Activity   Alcohol use: Not Currently    Comment: every couple weeks   Drug use: No   Sexual activity: Not on file  Other Topics Concern   Not on file  Social History Narrative   Left Handed    Lives in a one story home    Social Drivers of Health   Financial Resource Strain: Not on file  Food Insecurity: Not on file  Transportation Needs: Not on file  Physical Activity: Not on file  Stress: Not on file  Social Connections: Not on file  Intimate Partner Violence: Not on file    Review of Systems: See HPI, otherwise negative ROS  Physical Exam: Vital signs in last 24 hours:     General:   Alert,  Well-developed, well-nourished, pleasant and cooperative in NAD Head:  Normocephalic and atraumatic. Eyes:  Sclera clear, no icterus.   Conjunctiva pink. Ears:  Normal  auditory acuity. Nose:  No deformity, discharge,  or lesions. Msk:  Symmetrical without gross deformities. Normal posture. Extremities:  Without clubbing or edema. Neurologic:  Alert and  oriented x4;  grossly normal neurologically. Skin:  Intact without significant lesions or rashes. Psych:  Alert and cooperative. Normal mood and affect.  Impression/Plan: Hector Davies is here for a colonoscopy to be performed for colon cancer screening purposes.  The risks of the procedure including infection, bleed, or perforation as well as benefits, limitations, alternatives and imponderables have been reviewed with the patient. Questions have been answered. All parties agreeable.

## 2023-03-22 NOTE — Anesthesia Procedure Notes (Signed)
Date/Time: 03/22/2023 10:52 AM  Performed by: Franco Nones, CRNAPre-anesthesia Checklist: Patient identified, Emergency Drugs available, Suction available, Timeout performed and Patient being monitored Patient Re-evaluated:Patient Re-evaluated prior to induction Oxygen Delivery Method: Nasal Cannula

## 2023-03-22 NOTE — Telephone Encounter (Signed)
LMTRC

## 2023-03-25 ENCOUNTER — Encounter (HOSPITAL_COMMUNITY): Payer: Self-pay | Admitting: Internal Medicine

## 2023-03-25 LAB — SURGICAL PATHOLOGY

## 2023-04-12 ENCOUNTER — Other Ambulatory Visit: Payer: Self-pay

## 2023-04-12 ENCOUNTER — Telehealth: Payer: Self-pay | Admitting: Neurology

## 2023-04-12 DIAGNOSIS — G231 Progressive supranuclear ophthalmoplegia [Steele-Richardson-Olszewski]: Secondary | ICD-10-CM

## 2023-04-12 MED ORDER — CARBIDOPA-LEVODOPA 25-100 MG PO TABS: 1.5000 | ORAL_TABLET | Freq: Three times a day (TID) | ORAL | 0 refills | Status: DC

## 2023-04-12 NOTE — Telephone Encounter (Signed)
This has been sent 04/12/2023

## 2023-04-12 NOTE — Telephone Encounter (Signed)
1. Which medications need refilled? (List name and dosage, if known) carbidopa-levodopa  2. Which pharmacy/location is medication to be sent to? 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

## 2023-05-27 ENCOUNTER — Telehealth: Payer: Self-pay | Admitting: Neurology

## 2023-05-27 NOTE — Telephone Encounter (Signed)
 That would be okay, otherwise he can take 2 in the morning, 2 in the afternoon, and 1 at bedtime.  Unfortunately, the medication is not able to reverse any symptoms, so it is natural to feel that it may not be effective.  I recommend that he continue taking it, as symptoms would most likely get worse, if we were to stop it.  Thank you.

## 2023-05-27 NOTE — Telephone Encounter (Signed)
 Pt called in stating the carbidopa -levodopa is hard to split in half and he is just taking 5 a day. He feels it isn't helping him.

## 2023-05-28 NOTE — Telephone Encounter (Signed)
 Called patient and unable to leave a message as mailbox is not set up yet

## 2023-05-29 NOTE — Telephone Encounter (Signed)
 Called and spoke to patients daughter Hector Davies and informed her of Dr. Eliane Decree advice and recommendations per below:  That would be okay, otherwise he can take 2 in the morning, 2 in the afternoon, and 1 at bedtime.  Unfortunately, the medication is not able to reverse any symptoms, so it is natural to feel that it may not be effective.  I recommend that he continue taking it, as symptoms would most likely get worse, if we were to stop it.    Patients daughter verbalized understanding and had no further questions or concerns and will relay the message to patient.

## 2023-07-30 ENCOUNTER — Ambulatory Visit (INDEPENDENT_AMBULATORY_CARE_PROVIDER_SITE_OTHER): Payer: MEDICAID | Admitting: Neurology

## 2023-07-30 ENCOUNTER — Encounter: Payer: Self-pay | Admitting: Neurology

## 2023-07-30 DIAGNOSIS — G231 Progressive supranuclear ophthalmoplegia [Steele-Richardson-Olszewski]: Secondary | ICD-10-CM | POA: Diagnosis not present

## 2023-07-30 MED ORDER — CARBIDOPA-LEVODOPA 25-100 MG PO TABS: ORAL_TABLET | ORAL | 3 refills | Status: AC

## 2023-07-30 NOTE — Progress Notes (Signed)
 Follow-up Visit   Date: 07/30/23   Hector Davies MRN: [id number] DOB: 18-Oct-1965   Interim History: Hector Davies is a 58 y.o. left-handed Caucasian male with hyperlipidemia, depression, COPD, anxiety, and tobacco abuse returning to the clinic for follow-up of progressive nuclear palsy.  The patient was accompanied to the clinic by self.  IMPRESSION/PLAN: Atypical parkinson syndrome (DAT scan positive), consistent with progressive supranuclear palsy.  Exam shows vertical gaze paresis, fenestrating gait, rigidity, and bradykinesia.  Clinically, he continues to have frequent falls.        PLAN/RECOMMENDATIONS:  Increase sinemet  25/100 2 tablet in the morning, 2 tablets in the afternoon, and 1 tablet in the evening.  He was previously on sinemet  1.5 tablet TID, but he had difficulty with cutting the tablets in half. He remains very high risk of fall.  He is compliant with using a cane.  I recommend using a walker for long distances.  Unfortunately, he no longer has his powerchair as his mobile home cannot accomodate it.   Return to clinic in 6 months  --------------------------------------------  History of present illness: Starting around the spring of 2022, he began having leg weakness, which he describes more as imbalance.  He looses his balance all the time tends to fall backwards and cannot always break his falls.  He says that he falls daily.     He smokes 2PPD x 45 years, previously smoking 4 PPD. He has remote history of drinking 6-12 pack per day x 10 years, stopped this 10 years ago.  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    He had extensive neurological evaluation including MRI neuroaxis, which has been notable for moderate lumbar canal stenosis at L3-4 and L3-5.  MRI brain, cervical and thoracic spine was unremarkable.  Neurosurgery was consulted and did not feel that symptoms were severe enough to be  caused by his lumbar canal stenosis. He underwent DAT scan in 2023 which was positive and diagnosed with PSP.  He was using a powerchair but it was hard to maneuver in his home.   UPDATE 06/12/2022:  He reports ongoing issues with falling backwards and tends to fall about twice daily.  Fortunately, he has not injured himself. He is only able to use powerchair at home because he does not have a ramp.  He still has choking spells and was unable to go for his swallow study the last time.  He is struggling financially because his disability is not approved and he is unable to work.  He has not been able to pick up his medications, because he cannot afford $4 copay.  He is trying to find work, but unable to find something which allows him to sit.  Unfortunately, he is not computer savvy so cannot work from home. He tells me that he needs to move out of his current home because of plumbing issues and landlord has given him 6 weeks.  He is not sure whether he and his daughters will be moving.    UPDATE 01/29/2023:  He is here for follow-up visit.  He was approved for SSI disability in July.  He moved into a mobile home in September with his son and two daughters.  He is unable to use a power chair in his current home, so manages with a cane.  He continues to fall backwards multiple times a day.  Fortunately, he has not suffered any injuries.  UPDATE 07/30/2023:  He is here for follow-up visit.  He continues to fall frequently, especially with turning.  He cannot use a walker or powerchair because his home cannot accommodate it. He injured his left knee a few weeks ago and was given prednisone taper, but continues to have pain.  He has difficulty with writing and holding objects, because he gets shaky.   Medications:  Current Outpatient Medications on File Prior to Visit  Medication Sig Dispense Refill   albuterol  (VENTOLIN  HFA) 108 (90 Base) MCG/ACT inhaler Inhale 2 puffs into the lungs 4 (four) times daily as  needed.     aspirin EC 81 MG tablet Take 81 mg by mouth daily. Swallow whole.     citalopram (CELEXA) 40 MG tablet Take 40 mg by mouth daily. 40 mg Daily  Haven't taken in 2 weeks.     metFORMIN (GLUCOPHAGE) 500 MG tablet Take 250 mg by mouth daily.     Multiple Vitamins-Minerals (MULTIVITAMIN WITH MINERALS) tablet Take 1 tablet by mouth daily.     rosuvastatin (CRESTOR) 40 MG tablet SMARTSIG:1 Tablet(s) By Mouth Every Evening     SPIRIVA HANDIHALER 18 MCG inhalation capsule Place 18 mcg into inhaler and inhale daily.     tamsulosin (FLOMAX) 0.4 MG CAPS capsule Take 0.8 mg by mouth daily.     No current facility-administered medications on file prior to visit.    Allergies: No Known Allergies  Vital Signs:  Ht 5\' 9"  (1.753 m)   BMI 32.78 kg/m   Neurological Exam: MENTAL STATUS including orientation to time, place, person, recent and remote memory, attention span and concentration, language, and fund of knowledge is normal.  Speech is soft, not dysarthric.  CRANIAL NERVES:   Normal conjugate, extra-ocular eye movements in all directions of gaze, except restricted upgaze bilaterally.  No ptosis .  Face is symmetric.   MOTOR:  Motor strength is 5/5 in all extremities. There is mild-moderate rigidity in the bilateral arms and legs.  No atrophy or fasciculations.  Trace hand tremor when hands outstretched.      COORDINATION/GAIT:   Mild decrement in amplitude with finger tapping. Gait appears wide based, small shuffling steps, appears stiff and ataxic, assisted with cane. Unsteady with turns.   Data: MRI cervical spine wo contrast 02/03/2021: 1. Motion degraded examination. 2. Multilevel cervical disc degeneration with mild spinal stenosis and moderate to severe neural foraminal stenosis at C5-6 and C6-7.  MRI thoracic spine wo contrast 02/03/2021: Mild thoracic spondylosis without stenosis.  MRI brain wo contrast 12/29/2020: 1. Mildly motion degraded exam with no acute intracranial  abnormality.   2. Bilateral mastoid effusions with possible right middle ear opacification. Such mastoid fluid is most commonly  postinflammatory. But consider right side otitis media or cholesteatoma.  MRI lumbar spine wo contrast 12/29/2020: IMPRESSION: 1. 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 Moderate spinal stenosis at L3-L4 and L4-L5, mild at L1-L2 and L2-L3.   2. Disc degeneration most pronounced at L4-L5. Mild bilateral L3 and L4 neural foraminal stenosis.    DAT scan 04/27/2021: Significant decreased striatal Ioflupane activity as above. This pattern can be seen in Parkinsonian syndromes.   Of note, DaTSCAN  is not diagnostic of Parkinsonian syndromes, which remains a clinical diagnosis. DaTscan  is an adjuvant test to aid in the clinical diagnosis of Parkinsonian syndromes    Thank you for allowing me to participate in patient's care.  If I can answer any  additional questions, I would be pleased to do so.    Sincerely,    Haris Baack K. Lydia Sams, DO

## 2023-10-08 ENCOUNTER — Other Ambulatory Visit (HOSPITAL_COMMUNITY): Payer: Self-pay | Admitting: Family Medicine

## 2023-10-08 DIAGNOSIS — Z136 Encounter for screening for cardiovascular disorders: Secondary | ICD-10-CM

## 2023-10-08 DIAGNOSIS — Z87891 Personal history of nicotine dependence: Secondary | ICD-10-CM

## 2023-11-19 ENCOUNTER — Ambulatory Visit (HOSPITAL_COMMUNITY)
Admission: RE | Admit: 2023-11-19 | Discharge: 2023-11-19 | Disposition: A | Discharge status: Home / Self Care | Source: Ambulatory Visit | Attending: Family Medicine | Admitting: Family Medicine

## 2023-11-19 DIAGNOSIS — Z136 Encounter for screening for cardiovascular disorders: Secondary | ICD-10-CM | POA: Insufficient documentation

## 2023-11-19 DIAGNOSIS — Z87891 Personal history of nicotine dependence: Secondary | ICD-10-CM | POA: Insufficient documentation

## 2023-11-21 ENCOUNTER — Ambulatory Visit (HOSPITAL_COMMUNITY)
Admission: RE | Admit: 2023-11-21 | Discharge: 2023-11-21 | Disposition: A | Discharge status: Home / Self Care | Source: Ambulatory Visit | Attending: Family Medicine | Admitting: Family Medicine

## 2023-11-21 DIAGNOSIS — Z136 Encounter for screening for cardiovascular disorders: Secondary | ICD-10-CM | POA: Insufficient documentation

## 2023-11-21 DIAGNOSIS — Z87891 Personal history of nicotine dependence: Secondary | ICD-10-CM | POA: Insufficient documentation

## 2024-04-28 ENCOUNTER — Ambulatory Visit: Admitting: Neurology
